# Patient Record
Sex: Female | Born: 1985 | Race: White | Hispanic: No | Marital: Single | Smoking: Never smoker
Health system: Southern US, Community
[De-identification: ages and names within clinical notes are randomized; demographics above are authoritative.]

## PROBLEM LIST (undated history)

## (undated) DIAGNOSIS — M1990 Unspecified osteoarthritis, unspecified site: Secondary | ICD-10-CM

## (undated) HISTORY — PX: ESOPHAGOGASTRODUODENOSCOPY: SHX1529

## (undated) HISTORY — DX: Unspecified osteoarthritis, unspecified site: M19.90

## (undated) HISTORY — PX: APPENDECTOMY: SHX54

## (undated) HISTORY — PX: KNEE SURGERY: SHX244

## (undated) HISTORY — PX: OTHER SURGICAL HISTORY: SHX169

---
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
## 2007-03-24 ENCOUNTER — Encounter (HOSPITAL_COMMUNITY): Admission: RE | Admit: 2007-03-24 | Discharge: 2007-04-23 | Payer: Self-pay | Admitting: Orthopaedic Surgery

## 2009-06-19 ENCOUNTER — Encounter: Payer: Self-pay | Admitting: Gastroenterology

## 2009-06-19 ENCOUNTER — Ambulatory Visit: Payer: Self-pay | Admitting: Gastroenterology

## 2009-06-19 DIAGNOSIS — K389 Disease of appendix, unspecified: Secondary | ICD-10-CM | POA: Insufficient documentation

## 2009-06-19 DIAGNOSIS — R1013 Epigastric pain: Secondary | ICD-10-CM | POA: Insufficient documentation

## 2009-06-19 DIAGNOSIS — K5909 Other constipation: Secondary | ICD-10-CM | POA: Insufficient documentation

## 2009-06-19 DIAGNOSIS — Z91040 Latex allergy status: Secondary | ICD-10-CM | POA: Insufficient documentation

## 2009-06-20 ENCOUNTER — Encounter: Payer: Self-pay | Admitting: Gastroenterology

## 2009-06-21 ENCOUNTER — Encounter: Payer: Self-pay | Admitting: Internal Medicine

## 2009-06-21 LAB — CONVERTED CEMR LAB
ALT: 13 units/L (ref 0–35)
AST: 16 units/L (ref 0–37)
Albumin: 4.7 g/dL (ref 3.5–5.2)
Alkaline Phosphatase: 44 units/L (ref 39–117)
BUN: 10 mg/dL (ref 6–23)
Basophils Absolute: 0 10*3/uL (ref 0.0–0.1)
Basophils Relative: 1 % (ref 0–1)
CO2: 22 meq/L (ref 19–32)
Calcium: 9.6 mg/dL (ref 8.4–10.5)
Chloride: 106 meq/L (ref 96–112)
Creatinine, Ser: 0.72 mg/dL (ref 0.40–1.20)
Eosinophils Absolute: 0.4 10*3/uL (ref 0.0–0.7)
Eosinophils Relative: 7 % — ABNORMAL HIGH (ref 0–5)
Glucose, Bld: 86 mg/dL (ref 70–99)
HCT: 39.3 % (ref 36.0–46.0)
Hemoglobin: 13.4 g/dL (ref 12.0–15.0)
Lipase: 15 units/L (ref 0–75)
Lymphocytes Relative: 34 % (ref 12–46)
Lymphs Abs: 2.1 10*3/uL (ref 0.7–4.0)
MCHC: 34.1 g/dL (ref 30.0–36.0)
MCV: 94.2 fL (ref 78.0–100.0)
Monocytes Absolute: 0.4 10*3/uL (ref 0.1–1.0)
Monocytes Relative: 7 % (ref 3–12)
Neutro Abs: 3.1 10*3/uL (ref 1.7–7.7)
Neutrophils Relative %: 51 % (ref 43–77)
Platelets: 323 10*3/uL (ref 150–400)
Potassium: 4.1 meq/L (ref 3.5–5.3)
RBC: 4.17 M/uL (ref 3.87–5.11)
RDW: 13 % (ref 11.5–15.5)
Sodium: 143 meq/L (ref 135–145)
Total Bilirubin: 0.5 mg/dL (ref 0.3–1.2)
Total Protein: 7.6 g/dL (ref 6.0–8.3)
WBC: 6.1 10*3/uL (ref 4.0–10.5)

## 2009-06-26 ENCOUNTER — Ambulatory Visit (HOSPITAL_COMMUNITY): Admission: RE | Admit: 2009-06-26 | Discharge: 2009-06-26 | Payer: Self-pay | Admitting: Internal Medicine

## 2009-06-26 ENCOUNTER — Ambulatory Visit: Payer: Self-pay | Admitting: Internal Medicine

## 2009-07-03 ENCOUNTER — Encounter: Payer: Self-pay | Admitting: Internal Medicine

## 2009-07-03 ENCOUNTER — Ambulatory Visit (HOSPITAL_COMMUNITY): Admission: RE | Admit: 2009-07-03 | Discharge: 2009-07-03 | Payer: Self-pay | Admitting: Internal Medicine

## 2009-07-04 ENCOUNTER — Encounter: Payer: Self-pay | Admitting: Internal Medicine

## 2009-07-26 ENCOUNTER — Encounter (INDEPENDENT_AMBULATORY_CARE_PROVIDER_SITE_OTHER): Payer: Self-pay

## 2009-11-18 ENCOUNTER — Ambulatory Visit (HOSPITAL_COMMUNITY): Admission: RE | Admit: 2009-11-18 | Discharge: 2009-11-18 | Payer: Self-pay | Admitting: Orthopaedic Surgery

## 2010-12-21 ENCOUNTER — Encounter: Payer: Self-pay | Admitting: Orthopaedic Surgery

## 2011-04-14 NOTE — Op Note (Signed)
Caroline Jackson, Caroline Jackson                ACCOUNT NO.:  [id number]   MEDICAL RECORD NO.:  [id number]          PATIENT TYPE:  AMB   LOCATION:  DAY                           FACILITY:  [facility]   PHYSICIAN:  R. Roetta Sessions, M.D. DATE OF BIRTH:  [birth year]   DATE OF PROCEDURE:  06/26/2009  DATE OF DISCHARGE:                               OPERATIVE REPORT   PROCEDURE:  Diagnostic esophagogastroduodenoscopy.   INDICATIONS FOR PROCEDURE:  The patient is a 25 year old lady with  intermittent epigastric pain, some background symptoms of constipation.  Symptoms do have both a postprandial component and a musculoskeletal  component.  An EGD is now being done to further evaluate her symptoms.  Risks, benefits and limitations reviewed previously and again at the  bedside.  Please see the documentation in the medical record.   PROCEDURE NOTE:  Oxygen saturation, blood pressure, pulse, respirations  were monitor throughout the entire procedure.  Conscious sedation:  Versed 5 mg IV, Demerol 100 mg IV in divided doses.  Instrument:  Pentax  video chip system.   FINDINGS:  Examination of tubular esophagus revealed normal-appearing  mucosa.  The EG junction was easily traversed.  The stomach:  Gastric  cavity was emptied, insufflated well with air.  Thorough examination of  the gastric mucosa, including retroflexion in the proximal stomach,  esophagogastric junction demonstrated apparently normal mucosa.  Pylorus  was patent, easily traversed.  Examination of the bulb and second  portion revealed no abnormalities.   Therapeutic/diagnostic maneuvers performed:  None.   The patient tolerated procedure well and was reacted in endoscopy.   IMPRESSION:  Normal esophagus, stomach, D1, D2.   No endoscopic explanation for the patient's symptoms.  She has lost a  significant amount of weight recently with [employer].  I feel we  ought to take a look at her gallbladder.   RECOMMENDATIONS:  1. Continue  MiraLax as needed, which has been very effective in      combating her constipation.  2. Gallbladder ultrasound.  3. Further recommendations to follow.      Jonathon Bellows, M.D.  Electronically Signed     RMR/MEDQ  D:  06/26/2009  T:  06/26/2009  Job:  [id number]   cc:   Doreen Beam, MD  Fax: [phone]

## 2012-05-20 ENCOUNTER — Emergency Department (HOSPITAL_COMMUNITY): Payer: 59

## 2012-05-20 ENCOUNTER — Encounter (HOSPITAL_COMMUNITY): Payer: Self-pay | Admitting: Anesthesiology

## 2012-05-20 ENCOUNTER — Encounter (HOSPITAL_COMMUNITY): Payer: Self-pay | Admitting: *Deleted

## 2012-05-20 ENCOUNTER — Encounter (HOSPITAL_COMMUNITY)
Admission: EM | Disposition: A | Discharge status: Home / Self Care | Payer: Self-pay | Source: Home / Self Care | Attending: Emergency Medicine

## 2012-05-20 ENCOUNTER — Emergency Department (HOSPITAL_COMMUNITY): Payer: 59 | Admitting: Anesthesiology

## 2012-05-20 ENCOUNTER — Ambulatory Visit (HOSPITAL_COMMUNITY)
Admission: EM | Admit: 2012-05-20 | Discharge: 2012-05-21 | Disposition: A | Discharge status: Home / Self Care | Payer: 59 | Attending: Surgery | Admitting: Surgery

## 2012-05-20 DIAGNOSIS — R1013 Epigastric pain: Secondary | ICD-10-CM

## 2012-05-20 DIAGNOSIS — K5909 Other constipation: Secondary | ICD-10-CM

## 2012-05-20 DIAGNOSIS — K3580 Unspecified acute appendicitis: Secondary | ICD-10-CM | POA: Insufficient documentation

## 2012-05-20 DIAGNOSIS — R1033 Periumbilical pain: Secondary | ICD-10-CM | POA: Insufficient documentation

## 2012-05-20 DIAGNOSIS — Z91040 Latex allergy status: Secondary | ICD-10-CM

## 2012-05-20 HISTORY — PX: LAPAROSCOPIC APPENDECTOMY: SHX408

## 2012-05-20 LAB — URINALYSIS, ROUTINE W REFLEX MICROSCOPIC
Bilirubin Urine: NEGATIVE
Glucose, UA: NEGATIVE mg/dL
Hgb urine dipstick: NEGATIVE
Ketones, ur: NEGATIVE mg/dL
Leukocytes, UA: NEGATIVE
Nitrite: NEGATIVE
Protein, ur: NEGATIVE mg/dL
Specific Gravity, Urine: 1.009 (ref 1.005–1.030)
Urobilinogen, UA: 0.2 mg/dL (ref 0.0–1.0)
pH: 6 (ref 5.0–8.0)

## 2012-05-20 LAB — CBC
HCT: 36.5 % (ref 36.0–46.0)
Hemoglobin: 12.7 g/dL (ref 12.0–15.0)
MCH: 32.4 pg (ref 26.0–34.0)
MCHC: 34.8 g/dL (ref 30.0–36.0)
MCV: 93.1 fL (ref 78.0–100.0)
Platelets: 261 10*3/uL (ref 150–400)
RBC: 3.92 MIL/uL (ref 3.87–5.11)
RDW: 13 % (ref 11.5–15.5)
WBC: 17.5 10*3/uL — ABNORMAL HIGH (ref 4.0–10.5)

## 2012-05-20 LAB — DIFFERENTIAL
Basophils Absolute: 0 10*3/uL (ref 0.0–0.1)
Basophils Relative: 0 % (ref 0–1)
Eosinophils Absolute: 0.1 10*3/uL (ref 0.0–0.7)
Eosinophils Relative: 1 % (ref 0–5)
Lymphocytes Relative: 9 % — ABNORMAL LOW (ref 12–46)
Lymphs Abs: 1.5 10*3/uL (ref 0.7–4.0)
Monocytes Absolute: 0.7 10*3/uL (ref 0.1–1.0)
Monocytes Relative: 4 % (ref 3–12)
Neutro Abs: 15.2 10*3/uL — ABNORMAL HIGH (ref 1.7–7.7)
Neutrophils Relative %: 87 % — ABNORMAL HIGH (ref 43–77)

## 2012-05-20 LAB — BASIC METABOLIC PANEL
BUN: 14 mg/dL (ref 6–23)
CO2: 23 mEq/L (ref 19–32)
Calcium: 9.1 mg/dL (ref 8.4–10.5)
Chloride: 105 mEq/L (ref 96–112)
Creatinine, Ser: 0.65 mg/dL (ref 0.50–1.10)
GFR calc Af Amer: >90 mL/min (ref >90)
GFR calc non Af Amer: >90 mL/min (ref >90)
Glucose, Bld: 89 mg/dL (ref 70–99)
Potassium: 3.4 mEq/L — ABNORMAL LOW (ref 3.5–5.1)
Sodium: 138 mEq/L (ref 135–145)

## 2012-05-20 LAB — WET PREP, GENITAL
Clue Cells Wet Prep HPF POC: NONE SEEN
Trich, Wet Prep: NONE SEEN
Yeast Wet Prep HPF POC: NONE SEEN

## 2012-05-20 LAB — POCT PREGNANCY, URINE: Preg Test, Ur: NEGATIVE

## 2012-05-20 SURGERY — APPENDECTOMY, LAPAROSCOPIC
Anesthesia: General | Site: Abdomen | Wound class: Contaminated

## 2012-05-20 MED ORDER — PANTOPRAZOLE SODIUM 40 MG IV SOLR
40.0000 mg | INTRAVENOUS | Status: DC
  Filled 2012-05-20: qty 40

## 2012-05-20 MED ORDER — SUCCINYLCHOLINE CHLORIDE 20 MG/ML IJ SOLN
INTRAMUSCULAR | Status: DC | PRN
  Administered 2012-05-20: 100 mg via INTRAVENOUS

## 2012-05-20 MED ORDER — GLYCOPYRROLATE 0.2 MG/ML IJ SOLN
INTRAMUSCULAR | Status: DC | PRN
  Administered 2012-05-20: .8 mg via INTRAVENOUS

## 2012-05-20 MED ORDER — OXYCODONE-ACETAMINOPHEN 5-325 MG PO TABS
1.0000 | ORAL_TABLET | ORAL | Status: DC | PRN
  Administered 2012-05-21: 2 via ORAL
  Filled 2012-05-20: qty 2

## 2012-05-20 MED ORDER — ONDANSETRON HCL 4 MG/2ML IJ SOLN
INTRAMUSCULAR | Status: DC | PRN
  Administered 2012-05-20: 4 mg via INTRAVENOUS

## 2012-05-20 MED ORDER — MORPHINE SULFATE 4 MG/ML IJ SOLN
4.0000 mg | Freq: Once | INTRAMUSCULAR | Status: AC
  Administered 2012-05-20: 4 mg via INTRAVENOUS
  Filled 2012-05-20: qty 1

## 2012-05-20 MED ORDER — ONDANSETRON HCL 4 MG/2ML IJ SOLN
4.0000 mg | Freq: Once | INTRAMUSCULAR | Status: AC
  Administered 2012-05-20: 4 mg via INTRAVENOUS
  Filled 2012-05-20: qty 2

## 2012-05-20 MED ORDER — PROMETHAZINE HCL 25 MG/ML IJ SOLN
6.2500 mg | INTRAMUSCULAR | Status: DC | PRN
  Administered 2012-05-20: 6.25 mg via INTRAVENOUS

## 2012-05-20 MED ORDER — MORPHINE SULFATE 2 MG/ML IJ SOLN: 1.0000 mg | INTRAMUSCULAR | Status: DC | PRN

## 2012-05-20 MED ORDER — NEOSTIGMINE METHYLSULFATE 1 MG/ML IJ SOLN
INTRAMUSCULAR | Status: DC | PRN
  Administered 2012-05-20: 5 mg via INTRAVENOUS

## 2012-05-20 MED ORDER — SODIUM CHLORIDE 0.9 % IR SOLN
Status: DC | PRN
  Administered 2012-05-20: 1000 mL

## 2012-05-20 MED ORDER — KCL IN DEXTROSE-NACL 20-5-0.45 MEQ/L-%-% IV SOLN
INTRAVENOUS | Status: DC
  Administered 2012-05-20 – 2012-05-21 (×2): via INTRAVENOUS
  Filled 2012-05-20 (×5): qty 1000

## 2012-05-20 MED ORDER — LACTATED RINGERS IV SOLN
INTRAVENOUS | Status: DC | PRN
  Administered 2012-05-20: 20:00:00 via INTRAVENOUS

## 2012-05-20 MED ORDER — ONDANSETRON HCL 4 MG PO TABS: 4.0000 mg | ORAL_TABLET | Freq: Four times a day (QID) | ORAL | Status: DC | PRN

## 2012-05-20 MED ORDER — KETOROLAC TROMETHAMINE 30 MG/ML IJ SOLN
15.0000 mg | Freq: Once | INTRAMUSCULAR | Status: AC | PRN
  Administered 2012-05-20: 30 mg via INTRAVENOUS

## 2012-05-20 MED ORDER — FENTANYL CITRATE 0.05 MG/ML IJ SOLN
INTRAMUSCULAR | Status: DC | PRN
  Administered 2012-05-20: 50 ug via INTRAVENOUS
  Administered 2012-05-20: 100 ug via INTRAVENOUS

## 2012-05-20 MED ORDER — IOHEXOL 300 MG/ML  SOLN
20.0000 mL | INTRAMUSCULAR | Status: AC
  Administered 2012-05-20 (×2): 20 mL via ORAL

## 2012-05-20 MED ORDER — SODIUM CHLORIDE 0.9 % IV SOLN
3.0000 g | INTRAVENOUS | Status: DC
  Filled 2012-05-20: qty 3

## 2012-05-20 MED ORDER — HYDROMORPHONE HCL PF 1 MG/ML IJ SOLN: 0.2500 mg | INTRAMUSCULAR | Status: DC | PRN

## 2012-05-20 MED ORDER — MIDAZOLAM HCL 5 MG/5ML IJ SOLN
INTRAMUSCULAR | Status: DC | PRN
  Administered 2012-05-20: 2 mg via INTRAVENOUS

## 2012-05-20 MED ORDER — NORETHINDRONE 0.35 MG PO TABS: 1.0000 | ORAL_TABLET | Freq: Every evening | ORAL | Status: DC

## 2012-05-20 MED ORDER — ENOXAPARIN SODIUM 40 MG/0.4ML ~~LOC~~ SOLN
40.0000 mg | SUBCUTANEOUS | Status: DC
  Filled 2012-05-20 (×2): qty 0.4

## 2012-05-20 MED ORDER — ROCURONIUM BROMIDE 100 MG/10ML IV SOLN
INTRAVENOUS | Status: DC | PRN
  Administered 2012-05-20: 40 mg via INTRAVENOUS

## 2012-05-20 MED ORDER — MEPERIDINE HCL 25 MG/ML IJ SOLN: 6.2500 mg | INTRAMUSCULAR | Status: DC | PRN

## 2012-05-20 MED ORDER — ONDANSETRON HCL 4 MG/2ML IJ SOLN: 4.0000 mg | Freq: Four times a day (QID) | INTRAMUSCULAR | Status: DC | PRN

## 2012-05-20 MED ORDER — AMPICILLIN-SULBACTAM SODIUM 3 (2-1) G IJ SOLR
3.0000 g | INTRAMUSCULAR | Status: DC | PRN
  Administered 2012-05-20: 3 g via INTRAVENOUS

## 2012-05-20 MED ORDER — PROPOFOL 10 MG/ML IV BOLUS
INTRAVENOUS | Status: DC | PRN
  Administered 2012-05-20: 200 mg via INTRAVENOUS

## 2012-05-20 MED ORDER — IOHEXOL 300 MG/ML  SOLN
100.0000 mL | Freq: Once | INTRAMUSCULAR | Status: AC | PRN
  Administered 2012-05-20: 100 mL via INTRAVENOUS

## 2012-05-20 MED ORDER — SODIUM CHLORIDE 0.9 % IV SOLN
INTRAVENOUS | Status: DC | PRN
  Administered 2012-05-20: 19:00:00 via INTRAVENOUS

## 2012-05-20 MED ORDER — LIDOCAINE HCL (CARDIAC) 20 MG/ML IV SOLN
INTRAVENOUS | Status: DC | PRN
  Administered 2012-05-20: 60 mg via INTRAVENOUS

## 2012-05-20 SURGICAL SUPPLY — 43 items
APPLIER CLIP ROT 10 11.4 M/L (STAPLE)
BENZOIN TINCTURE PRP APPL 2/3 (GAUZE/BANDAGES/DRESSINGS) ×2 IMPLANT
BLADE SURG ROTATE 9660 (MISCELLANEOUS) IMPLANT
CANISTER SUCTION 2500CC (MISCELLANEOUS) ×2 IMPLANT
CHLORAPREP W/TINT 26ML (MISCELLANEOUS) ×2 IMPLANT
CLIP APPLIE ROT 10 11.4 M/L (STAPLE) IMPLANT
CLOTH BEACON ORANGE TIMEOUT ST (SAFETY) ×2 IMPLANT
COVER SURGICAL LIGHT HANDLE (MISCELLANEOUS) ×2 IMPLANT
CUTTER FLEX LINEAR 45M (STAPLE) ×2 IMPLANT
DECANTER SPIKE VIAL GLASS SM (MISCELLANEOUS) ×2 IMPLANT
DERMABOND ADHESIVE PROPEN (GAUZE/BANDAGES/DRESSINGS) ×2
DERMABOND ADVANCED .7 DNX6 (GAUZE/BANDAGES/DRESSINGS) ×2 IMPLANT
DRAPE UTILITY 15X26 W/TAPE STR (DRAPE) ×4 IMPLANT
ELECT REM PT RETURN 9FT ADLT (ELECTROSURGICAL) ×2
ELECTRODE REM PT RTRN 9FT ADLT (ELECTROSURGICAL) ×1 IMPLANT
ENDOLOOP SUT PDS II  0 18 (SUTURE)
ENDOLOOP SUT PDS II 0 18 (SUTURE) IMPLANT
GLOVE BIOGEL M STRL SZ7.5 (GLOVE) IMPLANT
GLOVE BIOGEL PI IND STRL 8 (GLOVE) ×1 IMPLANT
GLOVE BIOGEL PI INDICATOR 8 (GLOVE) ×1
GLOVE SS N UNI LF 7.0 STRL (GLOVE) ×6 IMPLANT
GLOVE SS N UNI LF 7.5 STRL (GLOVE) ×2 IMPLANT
GOWN STRL NON-REIN LRG LVL3 (GOWN DISPOSABLE) ×4 IMPLANT
GOWN STRL REIN XL XLG (GOWN DISPOSABLE) ×2 IMPLANT
KIT BASIN OR (CUSTOM PROCEDURE TRAY) ×2 IMPLANT
KIT ROOM TURNOVER OR (KITS) ×2 IMPLANT
NS IRRIG 1000ML POUR BTL (IV SOLUTION) ×2 IMPLANT
PAD ARMBOARD 7.5X6 YLW CONV (MISCELLANEOUS) ×4 IMPLANT
POUCH SPECIMEN RETRIEVAL 10MM (ENDOMECHANICALS) ×2 IMPLANT
RELOAD 45 VASCULAR/THIN (ENDOMECHANICALS) ×2 IMPLANT
RELOAD STAPLE TA45 3.5 REG BLU (ENDOMECHANICALS) IMPLANT
SCALPEL HARMONIC ACE (MISCELLANEOUS) ×2 IMPLANT
SCISSORS LAP 5X35 DISP (ENDOMECHANICALS) IMPLANT
SET IRRIG TUBING LAPAROSCOPIC (IRRIGATION / IRRIGATOR) ×2 IMPLANT
SPECIMEN JAR SMALL (MISCELLANEOUS) ×2 IMPLANT
SUT MNCRL AB 4-0 PS2 18 (SUTURE) ×2 IMPLANT
SUT VICRYL 0 UR6 27IN ABS (SUTURE) IMPLANT
TOWEL OR 17X24 6PK STRL BLUE (TOWEL DISPOSABLE) ×2 IMPLANT
TOWEL OR 17X26 10 PK STRL BLUE (TOWEL DISPOSABLE) ×2 IMPLANT
TRAY FOLEY CATH 14FR (SET/KITS/TRAYS/PACK) ×2 IMPLANT
TRAY LAPAROSCOPIC (CUSTOM PROCEDURE TRAY) ×2 IMPLANT
TROCAR XCEL BLADELESS 5X75MML (TROCAR) ×4 IMPLANT
TROCAR XCEL BLUNT TIP 100MML (ENDOMECHANICALS) ×2 IMPLANT

## 2012-05-20 NOTE — ED Notes (Signed)
Lower rt. Abd. Pain since 11 am. Worse pain ever felt. Mild n/v.

## 2012-05-20 NOTE — Anesthesia Postprocedure Evaluation (Signed)
  Anesthesia Post-op Note  Patient: Caroline Jackson  Procedure(s) Performed: Procedure(s) (LRB): APPENDECTOMY LAPAROSCOPIC (N/A)  Patient Location: PACU  Anesthesia Type: General  Level of Consciousness: sedated  Airway and Oxygen Therapy: Patient Spontanous Breathing  Post-op Pain: mild  Post-op Assessment: Post-op Vital signs reviewed  Post-op Vital Signs: stable  Complications: No apparent anesthesia complications

## 2012-05-20 NOTE — ED Notes (Signed)
Patient is resting comfortably. 

## 2012-05-20 NOTE — Anesthesia Preprocedure Evaluation (Signed)
Anesthesia Evaluation  Patient identified by MRN, date of birth, ID band Patient awake    Reviewed: Allergy & Precautions, H&P , NPO status , Patient's Chart, lab work & pertinent test results  History of Anesthesia Complications Negative for: history of anesthetic complications  Airway Mallampati: I  Neck ROM: Full    Dental  (+) Teeth Intact and Dental Advisory Given   Pulmonary neg pulmonary ROS,  breath sounds clear to auscultation        Cardiovascular negative cardio ROS  Rhythm:Regular Rate:Normal     Neuro/Psych    GI/Hepatic negative GI ROS, Neg liver ROS,   Endo/Other  negative endocrine ROS  Renal/GU negative Renal ROS     Musculoskeletal   Abdominal   Peds  Hematology   Anesthesia Other Findings   Reproductive/Obstetrics                           Anesthesia Physical Anesthesia Plan  ASA: I and Emergent  Anesthesia Plan: General   Post-op Pain Management:    Induction: Intravenous  Airway Management Planned: Oral ETT  Additional Equipment:   Intra-op Plan:   Post-operative Plan: Extubation in OR  Informed Consent: I have reviewed the patients History and Physical, chart, labs and discussed the procedure including the risks, benefits and alternatives for the proposed anesthesia with the patient or authorized representative who has indicated his/her understanding and acceptance.   Dental advisory given  Plan Discussed with: CRNA and Surgeon  Anesthesia Plan Comments:         Anesthesia Quick Evaluation

## 2012-05-20 NOTE — Anesthesia Procedure Notes (Signed)
Procedure Name: Intubation Date/Time: 05/20/2012 7:41 PM Performed by: Molli Hazard Pre-anesthesia Checklist: Patient identified, Emergency Drugs available, Suction available and Patient being monitored Patient Re-evaluated:Patient Re-evaluated prior to inductionOxygen Delivery Method: Circle system utilized Preoxygenation: Pre-oxygenation with 100% oxygen Intubation Type: IV induction and Rapid sequence Laryngoscope Size: Miller and 2 Grade View: Grade I Tube type: Oral Tube size: 7.5 mm Number of attempts: 1 Airway Equipment and Method: Stylet Placement Confirmation: ETT inserted through vocal cords under direct vision,  positive ETCO2 and breath sounds checked- equal and bilateral Secured at: 23 cm Tube secured with: Tape Dental Injury: Teeth and Oropharynx as per pre-operative assessment

## 2012-05-20 NOTE — ED Provider Notes (Signed)
Patient alert, relatively comfortable now. She's able to ambuulate to the bathroom. CT scan has returned and is consistent with early appendicitis. She has required additional antiemetics, and pain control, with morphine. I contacted the general surgeon to see the patient for an operative procedure.   Medical screening examination/treatment/procedure(s) were conducted as a shared visit with non-physician practitioner(s) and myself.  I personally evaluated the patient during the encounter    Flint Melter, MD 05/21/12 0002

## 2012-05-20 NOTE — ED Provider Notes (Signed)
History     CSN: [id number]  Arrival date & time 05/20/12  1315   First MD Initiated Contact with Patient 05/20/12 1345      Chief Complaint  Patient presents with  . Abdominal Pain    (Consider location/radiation/quality/duration/timing/severity/associated sxs/prior treatment) Patient is a 26 y.o. female presenting with abdominal pain. The history is provided by the patient.  Abdominal Pain The primary symptoms of the illness include abdominal pain and nausea. The primary symptoms of the illness do not include fever, vomiting, diarrhea, dysuria, vaginal discharge or vaginal bleeding. The current episode started 3 to 5 hours ago. The onset of the illness was sudden. The problem has been gradually worsening.  The patient states that she believes she is currently not pregnant. The patient has not had a change in bowel habit. Additional symptoms associated with the illness include anorexia. Symptoms associated with the illness do not include chills, constipation, urgency, hematuria, frequency or back pain.  Pt states pain to the right lower abdomen. Worse with palpation and movement. Denies fever, denies urinary symptoms, denies vaginal discharge or bleeding. Did not take any medications prior to the arrival. No hx of the same  History reviewed. No pertinent past medical history.  Past Surgical History  Procedure Date  . Orthoscopic left knee    No family history on file.  History  Substance Use Topics  . Smoking status: Not on file  . Smokeless tobacco: Not on file  . Alcohol Use:     OB History    Grav Para Term Preterm Abortions TAB SAB Ect Mult Living                  Review of Systems  Constitutional: Negative for fever and chills.  Respiratory: Negative.   Cardiovascular: Negative.   Gastrointestinal: Positive for nausea, abdominal pain and anorexia. Negative for vomiting, diarrhea and constipation.  Genitourinary: Negative for dysuria, urgency, frequency,  hematuria, vaginal bleeding and vaginal discharge.  Musculoskeletal: Negative for back pain.  Skin: Negative.     Allergies  Latex  Home Medications   Current Outpatient Rx  Name Route Sig Dispense Refill  . NAPROXEN SODIUM 220 MG PO TABS Oral Take 440 mg by mouth daily as needed. As needed for pain.    . NORETHINDRONE 0.35 MG PO TABS Oral Take 1 tablet by mouth every evening.      BP 124/72  Pulse 99  Temp 97.8 F (36.6 C) (Oral)  Resp 16  SpO2 100%  LMP 04/29/2012  Physical Exam  Nursing note and vitals reviewed. Constitutional: She appears well-developed and well-nourished. No distress.  HENT:  Head: Normocephalic.  Eyes: Conjunctivae are normal.  Neck: Neck supple.  Cardiovascular: Normal rate, regular rhythm and normal heart sounds.   Pulmonary/Chest: Effort normal and breath sounds normal. No respiratory distress. She has no wheezes. She has no rales.  Abdominal: Soft. Bowel sounds are normal. There is no rebound.       RLQ tenderness, pain in RLQ with palpation over RUQ and LLQ. Guarding. No CVA tenderness  Genitourinary:       Normal external genetalia, small white discharge, no CMT, no adnexal tenderness  Neurological: She is alert.  Skin: Skin is warm and dry.    ED Course  Procedures (including critical care time)  Pt with RLQ, will get blood, pelvic exam, pain medications ordered.   Results for orders placed during the hospital encounter of 05/20/12  CBC      Component Value Range  WBC 17.5 (*) 4.0 - 10.5 K/uL   RBC 3.92  3.87 - 5.11 MIL/uL   Hemoglobin 12.7  12.0 - 15.0 g/dL   HCT 78.2  95.6 - 21.3 %   MCV 93.1  78.0 - 100.0 fL   MCH 32.4  26.0 - 34.0 pg   MCHC 34.8  30.0 - 36.0 g/dL   RDW 08.6  57.8 - 46.9 %   Platelets 261  150 - 400 K/uL  DIFFERENTIAL      Component Value Range   Neutrophils Relative 87 (*) 43 - 77 %   Neutro Abs 15.2 (*) 1.7 - 7.7 K/uL   Lymphocytes Relative 9 (*) 12 - 46 %   Lymphs Abs 1.5  0.7 - 4.0 K/uL   Monocytes  Relative 4  3 - 12 %   Monocytes Absolute 0.7  0.1 - 1.0 K/uL   Eosinophils Relative 1  0 - 5 %   Eosinophils Absolute 0.1  0.0 - 0.7 K/uL   Basophils Relative 0  0 - 1 %   Basophils Absolute 0.0  0.0 - 0.1 K/uL  BASIC METABOLIC PANEL      Component Value Range   Sodium 138  135 - 145 mEq/L   Potassium 3.4 (*) 3.5 - 5.1 mEq/L   Chloride 105  96 - 112 mEq/L   CO2 23  19 - 32 mEq/L   Glucose, Bld 89  70 - 99 mg/dL   BUN 14  6 - 23 mg/dL   Creatinine, Ser 6.29  0.50 - 1.10 mg/dL   Calcium 9.1  8.4 - 52.8 mg/dL   GFR calc non Af Amer >90  >90 mL/min   GFR calc Af Amer >90  >90 mL/min  URINALYSIS, ROUTINE W REFLEX MICROSCOPIC      Component Value Range   Color, Urine YELLOW  YELLOW   APPearance CLEAR  CLEAR   Specific Gravity, Urine 1.009  1.005 - 1.030   pH 6.0  5.0 - 8.0   Glucose, UA NEGATIVE  NEGATIVE mg/dL   Hgb urine dipstick NEGATIVE  NEGATIVE   Bilirubin Urine NEGATIVE  NEGATIVE   Ketones, ur NEGATIVE  NEGATIVE mg/dL   Protein, ur NEGATIVE  NEGATIVE mg/dL   Urobilinogen, UA 0.2  0.0 - 1.0 mg/dL   Nitrite NEGATIVE  NEGATIVE   Leukocytes, UA NEGATIVE  NEGATIVE  WET PREP, GENITAL      Component Value Range   Yeast Wet Prep HPF POC NONE SEEN  NONE SEEN   Trich, Wet Prep NONE SEEN  NONE SEEN   Clue Cells Wet Prep HPF POC NONE SEEN  NONE SEEN   WBC, Wet Prep HPF POC MODERATE (*) NONE SEEN  POCT PREGNANCY, URINE      Component Value Range   Preg Test, Ur NEGATIVE  NEGATIVE   No results found.  Elevated WBC, no adnexal tenderness. Will get CT to r/o appendicitis.    No diagnosis found.    MDM          Lottie Mussel, PA 05/23/12 1626

## 2012-05-20 NOTE — Preoperative (Signed)
Beta Blockers   Reason not to administer Beta Blockers:Not Applicable 

## 2012-05-20 NOTE — Op Note (Signed)
Appendectomy, Lap, Procedure Note  Indications: The patient presented with a history of right-sided abdominal pain. A CT revealed findings consistent with acute appendicitis.  Pre-operative Diagnosis: Acute appendicitis without mention of peritonitis  Post-operative Diagnosis: Same  Surgeon: [name]   Assistants: none  Anesthesia: General endotracheal anesthesia and Local anesthesia 0.25.% bupivacaine, with epinephrine  ASA Class: 1, E  Procedure Details  The patient was seen again in the Holding Room. The risks, benefits, complications, treatment options, and expected outcomes were discussed with the patient and/or family. The possibilities of perforation of viscus, bleeding, recurrent infection, the need for additional procedures, failure to diagnose a condition, and creating a complication requiring transfusion or operation were discussed. There was concurrence with the proposed plan and informed consent was obtained. The site of surgery was properly noted. The patient was taken to Operating Room, identified as Caroline Jackson and the procedure verified as Appendectomy. A Time Out was held and the above information confirmed.  The patient was placed in the supine position and general anesthesia was induced, along with placement of orogastric tube, SCDs, and a Foley catheter. The abdomen was prepped and draped in a sterile fashion. A 1.5 centimeter infraumbilical incision was made.  The umbilical stalk was elevated, and the midline fascia was incised with a #11 blade.  A Kelly clamp was used to confirm entrance into the peritoneal cavity.  A pursestring suture was passed around the incision with a 0 Vicryl.  A 12mm Hasson was introduced into the abdomen and the tails of the suture were used to hold the Hasson in place.   The pneumoperitoneum was then established to steady pressure of 15 mmHg.  Additional 5 mm cannulas then placed in the left lower quadrant of the abdomen and the suprapubic  region under direct visualization. A careful evaluation of the entire abdomen was carried out. The patient was placed in Trendelenburg and left lateral decubitus position. The small intestines were retracted in the cephalad and left lateral direction away from the pelvis and right lower quadrant. The patient was found to have an inflammed appendix that was extending into the pelvis. There was no evidence of perforation.  The appendix was carefully dissected. The appendix was was skeletonized with the harmonic scalpel.   The appendix was divided at its base using an endo-GIA stapler with a white load. No appendiceal stump was left in place. The appendix was removed from the abdomen with an Endocatch bag through the umbilical port.  There was no evidence of bleeding, leakage, or complication after division of the appendix. Irrigation was also performed and irrigate suctioned from the abdomen as well.  The umbilical port site was closed with the purse string suture. There was no residual palpable fascial defect.  The trocar site skin wounds were closed with 4-0 Monocryl.  Instrument, sponge, and needle counts were correct at the conclusion of the case.   Findings: The appendix was found to be inflamed. There were signs of necrosis.  There was not perforation. There was not abscess formation.  Estimated Blood Loss:  Minimal         Drains: none               Specimens: appendix         Complications:  None; patient tolerated the procedure well.         Disposition: PACU - hemodynamically stable.         Condition: stable  Mary Sella. Andrey Campanile, MD, FACS General,  Bariatric, & Minimally Invasive Surgery [facility]

## 2012-05-20 NOTE — ED Notes (Signed)
Family at bedside. 

## 2012-05-20 NOTE — Transfer of Care (Signed)
Immediate Anesthesia Transfer of Care Note  Patient: Caroline Jackson  Procedure(s) Performed: Procedure(s) (LRB): APPENDECTOMY LAPAROSCOPIC (N/A)  Patient Location: PACU  Anesthesia Type: General  Level of Consciousness: awake, alert  and oriented  Airway & Oxygen Therapy: Patient connected to nasal cannula oxygen  Post-op Assessment: Report given to PACU RN, Post -op Vital signs reviewed and stable and Patient moving all extremities X 4  Post vital signs: Reviewed and stable  Complications: No apparent anesthesia complications

## 2012-05-20 NOTE — H&P (Signed)
Caroline Jackson is an 26 y.o. female.   Chief Complaint: belly pain  HPI: 26 yo WF who developed periumbilical pain around 11:30 am. Pain was sharp and stabbing and shifted later to RLQ. Pt had to leave work and came to ED. No prior symptoms. Pain constant but would ease up at times. +chills. Denies fever/nausea/vomiting/diarrhea/constipation/melena/hematochezia/vag discharge. LMP last month. Last BM this am. +anorexia.    Denies meds & allergies other than daily OCP.  History reviewed. No pertinent past medical history.  Past Surgical History  Procedure Date  . Orthoscopic left knee  . Esophagogastroduodenoscopy     No family history on file. Social History:  reports that she has never smoked. She does not have any smokeless tobacco history on file. She reports that she drinks alcohol. She reports that she does not use illicit drugs.  Allergies:  Allergies  Allergen Reactions  . Latex Rash     (Not in a hospital admission)  Results for orders placed during the hospital encounter of 05/20/12 (from the past 48 hour(s))  CBC     Status: Abnormal   Collection Time   05/20/12  2:13 PM      Component Value Range Comment   WBC 17.5 (*) 4.0 - 10.5 K/uL    RBC 3.92  3.87 - 5.11 MIL/uL    Hemoglobin 12.7  12.0 - 15.0 g/dL    HCT 40.9  81.1 - 91.4 %    MCV 93.1  78.0 - 100.0 fL    MCH 32.4  26.0 - 34.0 pg    MCHC 34.8  30.0 - 36.0 g/dL    RDW 78.2  95.6 - 21.3 %    Platelets 261  150 - 400 K/uL   DIFFERENTIAL     Status: Abnormal   Collection Time   05/20/12  2:13 PM      Component Value Range Comment   Neutrophils Relative 87 (*) 43 - 77 %    Neutro Abs 15.2 (*) 1.7 - 7.7 K/uL    Lymphocytes Relative 9 (*) 12 - 46 %    Lymphs Abs 1.5  0.7 - 4.0 K/uL    Monocytes Relative 4  3 - 12 %    Monocytes Absolute 0.7  0.1 - 1.0 K/uL    Eosinophils Relative 1  0 - 5 %    Eosinophils Absolute 0.1  0.0 - 0.7 K/uL    Basophils Relative 0  0 - 1 %    Basophils Absolute 0.0  0.0 - 0.1 K/uL    BASIC METABOLIC PANEL     Status: Abnormal   Collection Time   05/20/12  2:13 PM      Component Value Range Comment   Sodium 138  135 - 145 mEq/L    Potassium 3.4 (*) 3.5 - 5.1 mEq/L    Chloride 105  96 - 112 mEq/L    CO2 23  19 - 32 mEq/L    Glucose, Bld 89  70 - 99 mg/dL    BUN 14  6 - 23 mg/dL    Creatinine, Ser 0.86  0.50 - 1.10 mg/dL    Calcium 9.1  8.4 - 57.8 mg/dL    GFR calc non Af Amer >90  >90 mL/min    GFR calc Af Amer >90  >90 mL/min   URINALYSIS, ROUTINE W REFLEX MICROSCOPIC     Status: Normal   Collection Time   05/20/12  3:02 PM      Component Value Range  Comment   Color, Urine YELLOW  YELLOW    APPearance CLEAR  CLEAR    Specific Gravity, Urine 1.009  1.005 - 1.030    pH 6.0  5.0 - 8.0    Glucose, UA NEGATIVE  NEGATIVE mg/dL    Hgb urine dipstick NEGATIVE  NEGATIVE    Bilirubin Urine NEGATIVE  NEGATIVE    Ketones, ur NEGATIVE  NEGATIVE mg/dL    Protein, ur NEGATIVE  NEGATIVE mg/dL    Urobilinogen, UA 0.2  0.0 - 1.0 mg/dL    Nitrite NEGATIVE  NEGATIVE    Leukocytes, UA NEGATIVE  NEGATIVE MICROSCOPIC NOT DONE ON URINES WITH NEGATIVE PROTEIN, BLOOD, LEUKOCYTES, NITRITE, OR GLUCOSE <1000 mg/dL.  POCT PREGNANCY, URINE     Status: Normal   Collection Time   05/20/12  3:10 PM      Component Value Range Comment   Preg Test, Ur NEGATIVE  NEGATIVE   WET PREP, GENITAL     Status: Abnormal   Collection Time   05/20/12  3:31 PM      Component Value Range Comment   Yeast Wet Prep HPF POC NONE SEEN  NONE SEEN OVERDILUTED PRIOR TO ARRIVAL IN LABORATORY   Trich, Wet Prep NONE SEEN  NONE SEEN    Clue Cells Wet Prep HPF POC NONE SEEN  NONE SEEN    WBC, Wet Prep HPF POC MODERATE (*) NONE SEEN MANY BACTERIA PRESENT    RADIOLOGICAL STUDIES: I have personally reviewed the radiological exams myself  Ct Abdomen Pelvis W Contrast  05/20/2012  *RADIOLOGY REPORT*  Clinical Data: Right lower quadrant pain.  CT ABDOMEN AND PELVIS WITH CONTRAST  Technique:  Multidetector CT imaging  of the abdomen and pelvis was performed following the standard protocol during bolus administration of intravenous contrast.  Contrast:  100 ml Omnipaque 300 IV.  Comparison: Ultrasound 07/03/2009  Findings: The appendix is mildly dilated with surrounding inflammatory change.  Appendicolith within the midportion of the appendix.  Findings compatible with early appendicitis.  Lung bases are clear.  No effusions.  Heart is normal size.  Liver, gallbladder, spleen, pancreas, adrenals and kidneys are unremarkable.  Elongated cystic tubular structure in the left adnexa, question ovarian cyst versus hydrosalpinx.  Uterus and right adnexa unremarkable.  Moderate stool throughout the colon.  Small bowel is decompressed. No free fluid, free air or adenopathy.  No acute bony abnormality.  IMPRESSION: Mildly dilated appendix with appendicolith in the midportion and early surrounding stranding.  Findings compatible with early acute appendicitis.  Original Report Authenticated By: Cyndie Chime, M.D.    Review of Systems  Constitutional: Positive for chills. Negative for fever, weight loss and diaphoresis.  HENT: Negative for hearing loss.   Eyes: Negative for discharge and redness.  Respiratory: Negative for shortness of breath.   Cardiovascular: Negative for chest pain, palpitations, leg swelling and PND.  Gastrointestinal: Positive for abdominal pain. Negative for nausea, vomiting, diarrhea, blood in stool and melena.  Genitourinary: Negative for dysuria, urgency and hematuria.  Musculoskeletal:       Has had several L knee scopes  Skin: Negative for rash.  Neurological: Negative for dizziness, tingling, focal weakness, seizures, weakness and headaches.  Psychiatric/Behavioral: Negative for memory loss and substance abuse.    Blood pressure 99/57, pulse 95, temperature 98.7 F (37.1 C), temperature source Oral, resp. rate 18, last menstrual period 04/29/2012, SpO2 98.00%. Physical Exam  Vitals  reviewed. Constitutional: She is oriented to person, place, and time. She appears well-developed and well-nourished. No  distress.  HENT:  Head: Normocephalic and atraumatic.  Right Ear: External ear normal.  Left Ear: External ear normal.  Eyes: Conjunctivae are normal. No scleral icterus.  Neck: Normal range of motion. Neck supple. No tracheal deviation present. No thyromegaly present.  Cardiovascular: Normal rate, regular rhythm and normal heart sounds.   Respiratory: Effort normal and breath sounds normal. No respiratory distress. She has no wheezes.  GI: Soft. She exhibits no distension. There is tenderness in the right lower quadrant and suprapubic area. There is guarding and tenderness at McBurney's point. There is no rigidity and no rebound.  Musculoskeletal: Normal range of motion. She exhibits no edema and no tenderness.  Neurological: She is alert and oriented to person, place, and time. She exhibits normal muscle tone.  Skin: Skin is warm and dry. No rash noted. She is not diaphoretic. No erythema.  Psychiatric: She has a normal mood and affect. Her behavior is normal. Judgment and thought content normal.     Assessment/Plan We discussed the etiology and management of acute appendicitis. We discussed operative and nonoperative management.  I recommended operative management along with IV antibiotics.  We discussed laparoscopic appendectomy. We discussed the risk and benefits of surgery including but not limited to bleeding, infection, injury to surrounding structures, need to convert to an open procedure, blood clot formation, post operative abscess or wound infection, staple line complications such as leak or bleeding, hernia formation, post operative ileus, need for additional procedures, anesthesia complications, and the typical postoperative course. I explained that the patient should expect a good improvement in their symptoms.  Mary Sella. Andrey Campanile, MD, FACS General, Bariatric, &  Minimally Invasive Surgery [facility] [place]   [facility] M 05/20/2012, 6:40 PM

## 2012-05-21 ENCOUNTER — Encounter (HOSPITAL_COMMUNITY): Payer: Self-pay | Admitting: *Deleted

## 2012-05-21 LAB — CBC
HCT: 33.9 % — ABNORMAL LOW (ref 36.0–46.0)
Hemoglobin: 11.5 g/dL — ABNORMAL LOW (ref 12.0–15.0)
MCH: 32.6 pg (ref 26.0–34.0)
MCHC: 33.9 g/dL (ref 30.0–36.0)
MCV: 96 fL (ref 78.0–100.0)
Platelets: 228 10*3/uL (ref 150–400)
RBC: 3.53 MIL/uL — ABNORMAL LOW (ref 3.87–5.11)
RDW: 13.4 % (ref 11.5–15.5)
WBC: 10.1 10*3/uL (ref 4.0–10.5)

## 2012-05-21 LAB — GC/CHLAMYDIA PROBE AMP, GENITAL
Chlamydia, DNA Probe: NEGATIVE
GC Probe Amp, Genital: NEGATIVE

## 2012-05-21 MED ORDER — PANTOPRAZOLE SODIUM 40 MG PO TBEC: 40.0000 mg | DELAYED_RELEASE_TABLET | Freq: Every day | ORAL | Status: DC

## 2012-05-21 MED ORDER — OXYCODONE-ACETAMINOPHEN 5-325 MG PO TABS: 1.0000 | ORAL_TABLET | ORAL | Status: AC | PRN

## 2012-05-21 NOTE — Discharge Summary (Signed)
Physician Discharge Summary  Patient ID: Caroline Jackson MRN: [id number] DOB/AGE: 28/12/1985 28 y.o.  Admit date: 05/20/2012 Discharge date: 05/21/2012  Admission Diagnoses:acute appendicitis  Discharge Diagnoses: same Principal Problem:  *Acute appendicitis   Discharged Condition: good  Hospital Course: unremarkable.  Consults: None  Significant Diagnostic Studies: CT abdomen pelvis  Treatments: surgery: lap appendectomy  Discharge Exam: Blood pressure 80/51, pulse 65, temperature 98 F (36.7 C), temperature source Oral, resp. rate 18, height 5\' 6"  (1.676 m), weight 168 lb 14 oz (76.6 kg), last menstrual period 04/29/2012, SpO2 100.00%. Incision/Wound:soft abdomen clean dry intact  Disposition: Final discharge disposition not confirmed  Discharge Orders    Future Orders Please Complete By Expires   Diet - low sodium heart healthy      Increase activity slowly      Discharge instructions      Comments:   Shower today.. Drive when comfortable.  Regular diet.  OOW for 1 week.  Return to office in 2 - 3 weeks.  Dr Andrey Campanile  [phone].     Medication List  As of 05/21/2012  9:49 AM   TAKE these medications         naproxen sodium 220 MG tablet   Commonly known as: ANAPROX   Take 440 mg by mouth daily as needed. As needed for pain.      norethindrone 0.35 MG tablet   Commonly known as: MICRONOR,CAMILA,ERRIN   Take 1 tablet by mouth every evening.      oxyCODONE-acetaminophen 5-325 MG per tablet   Commonly known as: PERCOCET   Take 1-2 tablets by mouth every 4 (four) hours as needed.             Signed: Llewellyn Choplin A. 05/21/2012, 9:49 AM

## 2012-05-21 NOTE — OR Nursing (Signed)
Used of local during Laparoscopic Appendectomy. Nechama Guard, RN

## 2012-05-21 NOTE — Progress Notes (Signed)
Discharge instructions/Med Rec Sheet reviewed w/ pt. Pt expressed understanding and copies given w/ prescriptions. Pt ambulated off unit in stable condition, accompanied by her husband.

## 2012-05-21 NOTE — Discharge Instructions (Signed)
Laparoscopic Appendectomy Care After Refer to this sheet in the next few weeks. These instructions provide you with information on caring for yourself after your procedure. Your caregiver may also give you more specific instructions. Your treatment has been planned according to current medical practices, but problems sometimes occur. Call your caregiver if you have any problems or questions after your procedure. HOME CARE INSTRUCTIONS  Do not drive while taking narcotic pain medicines.   Use stool softener if you become constipated from your pain medicines.   Change your bandages (dressings) as directed.   Keep your wounds clean and dry. You may wash the wounds gently with soap and water. Gently pat the wounds dry with a clean towel.   Do not take baths, swim, or use hot tubs for 10 days, or as instructed by your caregiver.   Only take over-the-counter or prescription medicines for pain, discomfort, or fever as directed by your caregiver.   You may continue your normal diet as directed.   Do not lift more than 10 pounds (4.5 kg) or play contact sports for 3 weeks, or as directed.   Slowly increase your activity after surgery.   Take deep breaths to avoid getting a lung infection (pneumonia).  SEEK MEDICAL CARE IF:  You have redness, swelling, or increasing pain in your wounds.   You have pus coming from your wounds.   You have drainage from a wound that lasts longer than 1 day.   You notice a bad smell coming from the wounds or dressing.   Your wound edges break open after stitches (sutures) have been removed.   You notice increasing pain in the shoulders (shoulder strap areas) or near your shoulder blades.   You develop dizzy episodes or fainting while standing.   You develop shortness of breath.   You develop persistent nausea or vomiting.   You cannot control your bowel functions or lose your appetite.   You develop diarrhea.  SEEK IMMEDIATE MEDICAL CARE IF:   You  have a fever.   You develop a rash.   You have difficulty breathing or sharp pains in your chest.   You develop any reaction or side effects to medicines given.  MAKE SURE YOU:  Understand these instructions.   Will watch your condition.   Will get help right away if you are not doing well or get worse.  Document Released: 11/16/2005 Document Revised: 11/05/2011 Document Reviewed: 05/26/2011 [facility] Patient Information 2012 [place], [place].

## 2012-05-23 MED FILL — Bupivacaine Inj 0.25% w/ Epinephrine 1:200000 (PF): INTRAMUSCULAR | Qty: 30 | Status: AC

## 2012-05-23 NOTE — ED Provider Notes (Signed)
Medical screening examination/treatment/procedure(s) were performed by non-physician practitioner and as supervising physician I was immediately available for consultation/collaboration.   Gavin Pound. Oletta Lamas, MD 05/23/12 1849

## 2012-05-25 ENCOUNTER — Telehealth (INDEPENDENT_AMBULATORY_CARE_PROVIDER_SITE_OTHER): Payer: Self-pay | Admitting: General Surgery

## 2012-05-25 NOTE — Telephone Encounter (Signed)
Appt made for 06/08/12 @ 12:00 pm. Patient aware.

## 2012-05-25 NOTE — Telephone Encounter (Signed)
Message copied by Liliana Cline on Wed May 25, 2012  2:32 PM ------      Message from: Zacarias Pontes      Created: Wed May 25, 2012 12:06 PM       Pt needs po apt with Dr Andrey Campanile...appendy sx was 6/21.Marland KitchenMarland Kitchenpls call [phone] around lunch if possible

## 2012-05-26 ENCOUNTER — Encounter (HOSPITAL_COMMUNITY): Payer: Self-pay | Admitting: General Surgery

## 2012-06-08 ENCOUNTER — Ambulatory Visit (INDEPENDENT_AMBULATORY_CARE_PROVIDER_SITE_OTHER): Payer: Self-pay | Admitting: General Surgery

## 2012-06-08 ENCOUNTER — Encounter (INDEPENDENT_AMBULATORY_CARE_PROVIDER_SITE_OTHER): Payer: Self-pay | Admitting: General Surgery

## 2012-06-08 VITALS — BP 116/72 | HR 76 | Temp 99.6°F | Resp 16 | Ht 66.0 in | Wt 182.2 lb

## 2012-06-08 DIAGNOSIS — Z09 Encounter for follow-up examination after completed treatment for conditions other than malignant neoplasm: Secondary | ICD-10-CM

## 2012-06-08 NOTE — Progress Notes (Signed)
Subjective:     Patient ID: Caroline Jackson, female   DOB: 09/26/1986, 26 y.o.   MRN: [id number]  HPI 26 year old Caucasian female comes in today for a postoperative appointment. She underwent a laparoscopic appendectomy for acute appendicitis on June 21. She was discharged from the hospital on June 22. She has returned to work. She denies any fever, chills, nausea, vomiting, diarrhea, or constipation. She reports a good appetite. She reports a good energy level. She denies any problems with her incisions.  Review of Systems     Objective:   Physical Exam BP 116/72  Pulse 76  Temp 99.6 F (37.6 C)  Resp 16  Ht 5\' 6"  (1.676 m)  Wt 182 lb 3.2 oz (82.645 kg)  BMI 29.41 kg/m2  LMP 04/29/2012  Gen: alert, NAD, non-toxic appearing Abd: soft, nontender, nondistended. Well-healed trocar sites. No cellulitis. No incisional hernia    Assessment:     26 year old status post laparoscopic appendectomy for acute appendicitis    Plan:     We discussed her pathology report. She has been released to full activities. She appears to be doing quite well. Followup as needed.  Mary Sella. Andrey Campanile, MD, FACS General, Bariatric, & Minimally Invasive Surgery [facility], [place]

## 2012-06-08 NOTE — Patient Instructions (Signed)
You can resume full activities

## 2015-03-10 ENCOUNTER — Emergency Department
Admission: EM | Admit: 2015-03-10 | Discharge: 2015-03-10 | Disposition: A | Discharge status: Home / Self Care | Payer: 59 | Source: Home / Self Care | Attending: Emergency Medicine | Admitting: Emergency Medicine

## 2015-03-10 ENCOUNTER — Encounter: Payer: Self-pay | Admitting: Emergency Medicine

## 2015-03-10 ENCOUNTER — Emergency Department (INDEPENDENT_AMBULATORY_CARE_PROVIDER_SITE_OTHER): Payer: 59

## 2015-03-10 DIAGNOSIS — S300XXA Contusion of lower back and pelvis, initial encounter: Secondary | ICD-10-CM | POA: Diagnosis not present

## 2015-03-10 DIAGNOSIS — M545 Low back pain: Secondary | ICD-10-CM | POA: Diagnosis not present

## 2015-03-10 DIAGNOSIS — M533 Sacrococcygeal disorders, not elsewhere classified: Secondary | ICD-10-CM | POA: Diagnosis not present

## 2015-03-10 DIAGNOSIS — S3992XA Unspecified injury of lower back, initial encounter: Secondary | ICD-10-CM

## 2015-03-10 DIAGNOSIS — M79605 Pain in left leg: Secondary | ICD-10-CM | POA: Diagnosis not present

## 2015-03-10 DIAGNOSIS — M25552 Pain in left hip: Secondary | ICD-10-CM | POA: Diagnosis not present

## 2015-03-10 MED ORDER — HYDROCODONE-ACETAMINOPHEN 5-325 MG PO TABS: 1.0000 | ORAL_TABLET | Freq: Four times a day (QID) | ORAL | Status: DC | PRN

## 2015-03-10 MED ORDER — CYCLOBENZAPRINE HCL 5 MG PO TABS: ORAL_TABLET | ORAL | Status: DC

## 2015-03-10 MED ORDER — MELOXICAM 7.5 MG PO TABS: ORAL_TABLET | ORAL | Status: DC

## 2015-03-10 NOTE — ED Notes (Signed)
Yesterday fell down 4 steps and landed on tailbone; tried ibuprofen last night without relief; went to work today but very uncomfortable.

## 2015-03-10 NOTE — ED Provider Notes (Signed)
CSN: [id number]     Arrival date & time 03/10/15  1629 History   First MD Initiated Contact with Patient 03/10/15 1703     Chief Complaint  Patient presents with  . Tailbone Pain    HPI Yesterday fell down 4 steps and landed on tailbone; tried ibuprofen last night without relief; went to work today but very uncomfortable. Pain 7 out of 10. Sharp. The worst pain is right over the tailbone but also moderate to severe pain mid LS-spine and also moderate pain left hip. No radiation down the legs. Denies numbness or focal weakness or bowel or bladder dysfunction. Denies fever or chills or nausea or vomiting or cardiorespiratory symptoms. No headache or loss of consciousness. Past Medical History  Diagnosis Date  . Arthritis    Past Surgical History  Procedure Laterality Date  . Orthoscopic  left knee  . Esophagogastroduodenoscopy    . Laparoscopic appendectomy  05/20/2012    Procedure: APPENDECTOMY LAPAROSCOPIC;  Surgeon: [name], MD,FACS;  Location: [facility] OR;  Service: General;  Laterality: N/A;  . Appendectomy     History reviewed. No pertinent family history. History  Substance Use Topics  . Smoking status: Never Smoker   . Smokeless tobacco: Not on file  . Alcohol Use: Yes     Comment: social   OB History    No data available     Review of Systems Remainder of Review of Systems negative for acute change except as noted in the HPI.  Allergies  Latex  Home Medications   Prior to Admission medications   Medication Sig Start Date End Date Taking? Authorizing Provider  cyclobenzaprine (FLEXERIL) 5 MG tablet Take 1 or 2 every 8 hours as needed for muscle relaxant. Caution: May cause drowsiness. 03/10/15   Lajean Manes, MD  HYDROcodone-acetaminophen (NORCO/VICODIN) 5-325 MG per tablet Take 1-2 tablets by mouth every 6 (six) hours as needed for severe pain. Take with food. Caution: May cause drowsiness 03/10/15   Lajean Manes, MD  meloxicam (MOBIC) 7.5 MG tablet Take 1 twice a  day as needed for pain. Take with food. (Do not take with any other NSAID.) 03/10/15   Lajean Manes, MD  naproxen sodium (ANAPROX) 220 MG tablet Take 440 mg by mouth daily as needed. As needed for pain.    Historical Provider, MD  norethindrone (MICRONOR,CAMILA,ERRIN) 0.35 MG tablet Take 1 tablet by mouth every evening.    Historical Provider, MD   BP 121/84 mmHg  Pulse 85  Temp(Src) 97.9 F (36.6 C) (Oral)  Resp 16  Ht  (1.676 m)  Wt 146 lb (66.225 kg)  BMI 23.58 kg/m2  SpO2 100%  LMP 02/26/2015 Physical Exam  Constitutional: She is oriented to person, place, and time. She appears well-developed and well-nourished. No distress.    Uncomfortable from low back and coccyx and left hip pain. Gait antalgic. She walks slowly.  HENT:  Head: Normocephalic and atraumatic.  Eyes: Conjunctivae and EOM are normal. Pupils are equal, round, and reactive to light. No scleral icterus.  Neck: Normal range of motion.  Cardiovascular: Normal rate.   Pulmonary/Chest: Effort normal.  Abdominal: She exhibits no distension.  Musculoskeletal: Normal range of motion.       Right hip: Normal. She exhibits normal range of motion, no tenderness and no bony tenderness.       Left hip: She exhibits bony tenderness.  LS-spine: Diffuse point tenderness directly over L3 L4 L5 and S1 and exquisitely tender over her coccyx. No  ecchymosis. Also tender, spasm bilateral paralumbar muscles exacerbated by twisting. Range of motion is decreased.  Left hip: Moderate tenderness diffusely. Range of motion intact but pain upon internal and external rotation. No ecchymosis  Lower extremities: Nontender. Neurovascular distally intact.   Neurological: She is alert and oriented to person, place, and time.  Motor, sensory, DTRs equal and intact bilaterally of upper and lower extremities  Skin: Skin is warm. No bruising, no laceration and no rash noted. She is not diaphoretic.  Psychiatric: She has a normal mood and  affect.  Nursing note and vitals reviewed.   ED Course  Procedures (including critical care time) Labs Review Labs Reviewed - No data to display  Imaging Review Dg Lumbar Spine Complete  03/10/2015   CLINICAL DATA:  LEFT hip pain. LEFT lumbago/low back pain. Pain radiating into the LEFT lower extremity.  EXAM: LUMBAR SPINE - COMPLETE 4+ VIEW  COMPARISON:  None.  FINDINGS: There is no evidence of lumbar spine fracture. Alignment is normal. Intervertebral disc spaces are maintained.  IMPRESSION: Negative.   Electronically Signed   By: Andreas Newport M.D.   On: 03/10/2015 18:20   Dg Sacrum/coccyx  03/10/2015   CLINICAL DATA:  Larey Seat down stairs 1 day prior, now with sacrum and coccyx pain.  EXAM: SACRUM AND COCCYX - 2+ VIEW  COMPARISON:  None.  FINDINGS: Cortical margins of the sacrum and coccyx are intact. The sacral alae are maintained. The sacroiliac joints are congruent. There is no fracture.  IMPRESSION: No fracture of the sacrum or coccyx.   Electronically Signed   By: Rubye Oaks M.D.   On: 03/10/2015 18:23   Dg Hip Unilat With Pelvis 1v Left  03/10/2015   CLINICAL DATA:  Fall down stairs 1 day prior, now with sacrum, coccyx, and left hip pain.  EXAM: LEFT HIP (WITH PELVIS) 1 VIEW  COMPARISON:  None.  FINDINGS: The cortical margins of the bony pelvis and left hip are intact. No fracture. Pubic symphysis and sacroiliac joints are congruent. Both femoral heads are well-seated in the respective acetabula.  IMPRESSION: No fracture or subluxation of the pelvis or left hip.   Electronically Signed   By: Rubye Oaks M.D.   On: 03/10/2015 18:22     MDM   1. Contusion of coccyx, initial encounter   2. Lower back injury, initial encounter   3. Hip pain, acute, left   4. Lumbar contusion, initial encounter    Reviewed with patient that x-rays LS-spine, coccyx, and left hip/pelvis were all within normal limits. No fracture or subluxation. Treatment options discussed, as well as risks,  benefits, alternatives. Patient voiced understanding and agreement with the following plans:  New Prescriptions   CYCLOBENZAPRINE (FLEXERIL) 5 MG TABLET    Take 1 or 2 every 8 hours as needed for muscle relaxant. Caution: May cause drowsiness.   HYDROCODONE-ACETAMINOPHEN (NORCO/VICODIN) 5-325 MG PER TABLET    Take 1-2 tablets by mouth every 6 (six) hours as needed for severe pain. Take with food. Caution: May cause drowsiness   MELOXICAM (MOBIC) 7.5 MG TABLET    Take 1 twice a day as needed for pain. Take with food. (Do not take with any other NSAID.)   wrote a note excusing her from work 03/11/15-03/12/15, may return to work 03/13/2015 Handouts and instruction sheets given.  Ice for another 24 hours. Then may try heat. May purchase special doughnut pillow to avoid pressure on coccyx area when trying to sit. Questions invited and answered. Follow-up with  PCP or orthopedist if no better one week, sooner if worse or new symptoms. Red flags discussed.-Emergency room if any red flag She voiced understanding and agreement with the above plans   Lajean Manesavid Massey, MD 03/10/15 1836

## 2015-10-12 ENCOUNTER — Encounter (HOSPITAL_COMMUNITY)
Admission: AD | Disposition: A | Discharge status: Home / Self Care | Payer: Self-pay | Source: Ambulatory Visit | Attending: Family Medicine

## 2015-10-12 ENCOUNTER — Ambulatory Visit (HOSPITAL_COMMUNITY)
Admission: AD | Admit: 2015-10-12 | Discharge: 2015-10-12 | Disposition: A | Discharge status: Home / Self Care | Payer: 59 | Source: Ambulatory Visit | Attending: Family Medicine | Admitting: Family Medicine

## 2015-10-12 ENCOUNTER — Encounter (HOSPITAL_COMMUNITY): Payer: Self-pay | Admitting: Anesthesiology

## 2015-10-12 ENCOUNTER — Inpatient Hospital Stay (HOSPITAL_COMMUNITY): Payer: 59 | Admitting: Anesthesiology

## 2015-10-12 ENCOUNTER — Encounter (HOSPITAL_COMMUNITY): Payer: Self-pay | Admitting: *Deleted

## 2015-10-12 DIAGNOSIS — S3141XA Laceration without foreign body of vagina and vulva, initial encounter: Secondary | ICD-10-CM | POA: Insufficient documentation

## 2015-10-12 DIAGNOSIS — Y9389 Activity, other specified: Secondary | ICD-10-CM | POA: Diagnosis not present

## 2015-10-12 DIAGNOSIS — D62 Acute posthemorrhagic anemia: Secondary | ICD-10-CM | POA: Diagnosis present

## 2015-10-12 DIAGNOSIS — S3140XA Unspecified open wound of vagina and vulva, initial encounter: Secondary | ICD-10-CM

## 2015-10-12 DIAGNOSIS — Y92019 Unspecified place in single-family (private) house as the place of occurrence of the external cause: Secondary | ICD-10-CM

## 2015-10-12 DIAGNOSIS — N939 Abnormal uterine and vaginal bleeding, unspecified: Secondary | ICD-10-CM | POA: Diagnosis present

## 2015-10-12 DIAGNOSIS — N930 Postcoital and contact bleeding: Secondary | ICD-10-CM

## 2015-10-12 DIAGNOSIS — X58XXXA Exposure to other specified factors, initial encounter: Secondary | ICD-10-CM

## 2015-10-12 HISTORY — PX: PERINEAL LACERATION REPAIR: SHX5389

## 2015-10-12 LAB — CBC
HCT: 30.6 % — ABNORMAL LOW (ref 36.0–46.0)
Hemoglobin: 10.3 g/dL — ABNORMAL LOW (ref 12.0–15.0)
MCH: 32.3 pg (ref 26.0–34.0)
MCHC: 33.7 g/dL (ref 30.0–36.0)
MCV: 95.9 fL (ref 78.0–100.0)
Platelets: 275 10*3/uL (ref 150–400)
RBC: 3.19 MIL/uL — ABNORMAL LOW (ref 3.87–5.11)
RDW: 13 % (ref 11.5–15.5)
WBC: 18.7 10*3/uL — ABNORMAL HIGH (ref 4.0–10.5)

## 2015-10-12 LAB — TYPE AND SCREEN
ABO/RH(D): O POS
Antibody Screen: NEGATIVE

## 2015-10-12 LAB — ABO/RH: ABO/RH(D): O POS

## 2015-10-12 SURGERY — SUTURE REPAIR, LACERATION, PERINEUM
Anesthesia: General | Site: Vagina

## 2015-10-12 MED ORDER — MORPHINE SULFATE (PF) 4 MG/ML IV SOLN
1.0000 mg | Freq: Once | INTRAVENOUS | Status: AC
Start: 2015-10-12 — End: 2015-10-12
  Administered 2015-10-12: 1 mg via INTRAVENOUS
  Filled 2015-10-12: qty 1

## 2015-10-12 MED ORDER — MIDAZOLAM HCL 5 MG/5ML IJ SOLN
INTRAMUSCULAR | Status: DC | PRN
  Administered 2015-10-12: 2 mg via INTRAVENOUS

## 2015-10-12 MED ORDER — FENTANYL CITRATE (PF) 100 MCG/2ML IJ SOLN
INTRAMUSCULAR | Status: AC
  Filled 2015-10-12: qty 4

## 2015-10-12 MED ORDER — OXYCODONE-ACETAMINOPHEN 5-325 MG PO TABS: 1.0000 | ORAL_TABLET | Freq: Four times a day (QID) | ORAL | Status: AC | PRN

## 2015-10-12 MED ORDER — KETOROLAC TROMETHAMINE 30 MG/ML IJ SOLN
INTRAMUSCULAR | Status: AC
Start: 2015-10-12 — End: 2015-10-12
  Filled 2015-10-12: qty 2

## 2015-10-12 MED ORDER — LIDOCAINE HCL (CARDIAC) 20 MG/ML IV SOLN
INTRAVENOUS | Status: AC
  Filled 2015-10-12: qty 5

## 2015-10-12 MED ORDER — KETOROLAC TROMETHAMINE 30 MG/ML IJ SOLN
INTRAMUSCULAR | Status: DC | PRN
  Administered 2015-10-12 (×2): 30 mg via INTRAVENOUS

## 2015-10-12 MED ORDER — PROPOFOL 10 MG/ML IV BOLUS
INTRAVENOUS | Status: AC
  Filled 2015-10-12: qty 20

## 2015-10-12 MED ORDER — FAMOTIDINE IN NACL 20-0.9 MG/50ML-% IV SOLN
20.0000 mg | Freq: Once | INTRAVENOUS | Status: AC
Start: 2015-10-12 — End: 2015-10-12
  Administered 2015-10-12 (×2): 20 mg via INTRAVENOUS
  Filled 2015-10-12: qty 50

## 2015-10-12 MED ORDER — FENTANYL CITRATE (PF) 100 MCG/2ML IJ SOLN
INTRAMUSCULAR | Status: DC | PRN
  Administered 2015-10-12: 100 ug via INTRAVENOUS

## 2015-10-12 MED ORDER — ONDANSETRON HCL 4 MG/2ML IJ SOLN
INTRAMUSCULAR | Status: DC | PRN
  Administered 2015-10-12: 4 mg via INTRAVENOUS

## 2015-10-12 MED ORDER — CITRIC ACID-SODIUM CITRATE 334-500 MG/5ML PO SOLN
30.0000 mL | Freq: Once | ORAL | Status: AC
Start: 2015-10-12 — End: 2015-10-12
  Administered 2015-10-12: 30 mL via ORAL
  Filled 2015-10-12: qty 15

## 2015-10-12 MED ORDER — LACTATED RINGERS IV SOLN
INTRAVENOUS | Status: DC
  Administered 2015-10-12 (×2): via INTRAVENOUS

## 2015-10-12 MED ORDER — LIDOCAINE HCL (CARDIAC) 20 MG/ML IV SOLN
INTRAVENOUS | Status: DC | PRN
  Administered 2015-10-12 (×2): 50 mg via INTRAVENOUS

## 2015-10-12 MED ORDER — DEXAMETHASONE SODIUM PHOSPHATE 4 MG/ML IJ SOLN
INTRAMUSCULAR | Status: DC | PRN
  Administered 2015-10-12: 4 mg via INTRAVENOUS

## 2015-10-12 MED ORDER — PROPOFOL 10 MG/ML IV BOLUS
INTRAVENOUS | Status: DC | PRN
  Administered 2015-10-12: 50 mg via INTRAVENOUS
  Administered 2015-10-12: 150 mg via INTRAVENOUS

## 2015-10-12 MED ORDER — ONDANSETRON HCL 4 MG/2ML IJ SOLN
INTRAMUSCULAR | Status: AC
  Filled 2015-10-12: qty 2

## 2015-10-12 MED ORDER — MIDAZOLAM HCL 2 MG/2ML IJ SOLN
INTRAMUSCULAR | Status: AC
  Filled 2015-10-12: qty 4

## 2015-10-12 MED ORDER — DEXAMETHASONE SODIUM PHOSPHATE 4 MG/ML IJ SOLN
INTRAMUSCULAR | Status: AC
Start: 2015-10-12 — End: 2015-10-12
  Filled 2015-10-12: qty 1

## 2015-10-12 SURGICAL SUPPLY — 10 items
BLADE SURG 15 STRL LF C SS BP (BLADE) IMPLANT
BLADE SURG 15 STRL SS (BLADE)
CLOTH BEACON ORANGE TIMEOUT ST (SAFETY) ×2 IMPLANT
GLOVE BIOGEL PI IND STRL 7.0 (GLOVE) ×1 IMPLANT
GLOVE BIOGEL PI INDICATOR 7.0 (GLOVE) ×1
GLOVE SURG SS PI 7.0 STRL IVOR (GLOVE) ×8 IMPLANT
PACK VAGINAL MINOR WOMEN LF (CUSTOM PROCEDURE TRAY) ×2 IMPLANT
PAD PREP 24X48 CUFFED NSTRL (MISCELLANEOUS) ×2 IMPLANT
SUT CHROMIC 2 0 CT 1 (SUTURE) ×4 IMPLANT
TOWEL OR 17X24 6PK STRL BLUE (TOWEL DISPOSABLE) ×2 IMPLANT

## 2015-10-12 NOTE — Transfer of Care (Signed)
Immediate Anesthesia Transfer of Care Note  Patient: Caroline Jackson  Procedure(s) Performed: Procedure(s): SUTURE REPAIR PERINEAL LACERATION (N/A)  Patient Location: PACU  Anesthesia Type:General  Level of Consciousness: awake, alert  and oriented  Airway & Oxygen Therapy: Patient Spontanous Breathing and Patient connected to nasal cannula oxygen  Post-op Assessment: Report given to RN and Post -op Vital signs reviewed and stable  Post vital signs: Reviewed and stable  Last Vitals:  Filed Vitals:   10/12/15 1633  BP: 114/75  Pulse: 100  Temp: 36.7 C  Resp: 18    Complications: No apparent anesthesia complications

## 2015-10-12 NOTE — H&P (Addendum)
Caroline Jackson is an 29 y.o. G0P0000 female.    Chief Complaint: vaginal bleeding   HPI: Patient had intercourse for first time in 1 year and began having pain and bleeding.  Intercourse was normal penile without toys or use of hands. She has been bleeding since 3 am.  She does not know nor can she quantify how much blood has been lost. She has been syncopal and passed out twice.  Past Medical History  Diagnosis Date  . Arthritis     Past Surgical History  Procedure Laterality Date  . Orthoscopic  left knee  . Esophagogastroduodenoscopy    . Laparoscopic appendectomy  05/20/2012    Procedure: APPENDECTOMY LAPAROSCOPIC;  Surgeon: [name], MD,FACS;  Location: [facility] OR;  Service: General;  Laterality: N/A;  . Appendectomy     Social History:  reports that she has never smoked. She does not have any smokeless tobacco history on file. She reports that she drinks alcohol. She reports that she does not use illicit drugs.   Allergies  Allergen Reactions  . Latex Rash    No current facility-administered medications on file prior to encounter.   Current Outpatient Prescriptions on File Prior to Encounter  Medication Sig Dispense Refill  . cyclobenzaprine (FLEXERIL) 5 MG tablet Take 1 or 2 every 8 hours as needed for muscle relaxant. Caution: May cause drowsiness. 20 tablet 0  . HYDROcodone-acetaminophen (NORCO/VICODIN) 5-325 MG per tablet Take 1-2 tablets by mouth every 6 (six) hours as needed for severe pain. Take with food. Caution: May cause drowsiness 12 tablet 0  . meloxicam (MOBIC) 7.5 MG tablet Take 1 twice a day as needed for pain. Take with food. (Do not take with any other NSAID.) 20 tablet 0  . naproxen sodium (ANAPROX) 220 MG tablet Take 440 mg by mouth daily as needed. As needed for pain.    Marland Kitchen norethindrone (MICRONOR,CAMILA,ERRIN) 0.35 MG tablet Take 1 tablet by mouth every evening.      A comprehensive review of systems was negative.  Blood pressure 114/75, pulse 100,  temperature 98 F (36.7 C), temperature source Oral, resp. rate 18. BP 114/75 mmHg  Pulse 100  Temp(Src) 98 F (36.7 C) (Oral)  Resp 18 General appearance: alert, cooperative and appears stated age Neck: supple, symmetrical, trachea midline Lungs: clear to auscultation bilaterally Breasts: normal appearance, no masses or tenderness Heart: regular rate and rhythm Abdomen: soft, non-tender; bowel sounds normal; no masses,  no organomegaly Pelvic: cervix normal in appearance, external genitalia normal and there is a large clot removed from the vaginal vault, there continues to be fresh blood but no active hemorrhage. Extremities: extremities normal, atraumatic, no cyanosis or edema Skin: Skin color, texture, turgor normal. No rashes or lesions Neurologic: Grossly normal  CBC    Component Value Date/Time   WBC 18.7* 10/12/2015 1658   RBC 3.19* 10/12/2015 1658   HGB 10.3* 10/12/2015 1658   HCT 30.6* 10/12/2015 1658   PLT 275 10/12/2015 1658   MCV 95.9 10/12/2015 1658   MCH 32.3 10/12/2015 1658   MCHC 33.7 10/12/2015 1658   RDW 13.0 10/12/2015 1658   LYMPHSABS 1.5 05/20/2012 1413   MONOABS 0.7 05/20/2012 1413   EOSABS 0.1 05/20/2012 1413   BASOSABS 0.0 05/20/2012 1413     Assessment/Plan Principal Problem:   Vaginal laceration  Will likely need surgical repair to stop bleeding.  Will check CBC, Type and screen and move to OR. Patient had a bite of sandwich at 2 pm. If  blood count is stable, may hold off until NPO status is more favorable. Blood as needed. Risks include but are not limited to bleeding, infection, injury to surrounding structures..  Likelihood of success is high.    Kenwood Rosiak S 10/12/2015, 4:44 PM

## 2015-10-12 NOTE — Discharge Instructions (Signed)
Vaginal Laceration °A vaginal laceration is a tear in the vaginal wall. A vaginal tear falls into one of three categories:  °1. Obstetrically related tears that occur at the time of childbirth. °2. Trauma-related tears (most often related to sexual intercourse). °3. Spontaneous tears. °Vaginal tears can cause heavy bleeding (hemorrhaging) depending on severity of the tear. Tears can be intensely tender and interfere with normal activities of living. They can make sexual intercourse painful and bring on significant burning with urination. If you have a vaginal laceration, the area around your vagina may be painful when you touch or wipe it. Even light pressure from clothing may cause some pain. Vaginal tear need to be evaluated by your caregiver.   °CAUSES  °· Obstetric-related causes, such as childbirth. °· Trauma that may result from an accident during an activity, such as sexual intercourse or a bicycle ride. °· Spontaneous causes related to aging, failed healing of a past obstetric tear, chronic irritation, or skin changes that are not well understood. °SYMPTOMS  °· Slight to heavy vaginal bleeding. °· Vaginal swelling. °· Mild to severe pain. °· Vaginal tenderness. °DIAGNOSIS  °If the tear happened during childbirth, your caregiver can diagnose the tear at that time. To diagnose a vaginal tear that happened spontaneously or because of trauma, your caregiver will perform a physical exam. During the physical exam, your caregiver may also look for any signs of trouble that may need further testing. If there is hemorrhaging, your caregiver may suggest blood tests to determine the extent of bleeding. Imaging tests may be performed, such as an ultrasonography or computed tomography (CT), to look for internal damage. A biopsy may be need if there are signs of a more serious problem.  °TREATMENT  °Treatment depends on the severity of the tear. For minor tears that heal on their own, treatment may only consist of keeping  the area clean and dry. Some tears need to be repaired with stitches. Other tears may heal on their own with help from various remedies, such as antibiotic ointments, medicated creams, or petroleum products. Depending on the circumstances, oral hormones may also be suggested. Hormone remedies may also be in the form of topical creams and vaginal tablets. For more concerning situations, hospitalization and surgical repair of the tear may be needed. °HOME CARE INSTRUCTIONS  °· Take warm-water baths that cover your hips and buttocks (sitz bath) 2 to 3 times a day. This may help any discomfort and swelling.   °· Only take over-the-counter or prescription medicines for pain, discomfort, or fever as directed by your caregiver. Do not use aspirin because it can cause increased bleeding.   °· Do not douche, use tampons, or have intercourse until your caregiver says it is okay.    °· A bandage (dressing) may have been applied. Change the dressing once a day or as directed. If the dressing sticks, soak it off with warm, soapy water.   °· Apply ice or witch hazel pads to the vagina to lessen any pain or discomfort.   °· Take a stool softener or follow a special diet as directed by your caregiver. This will help ease discomfort associated with bowel movements.   °SEEK IMMEDIATE MEDICAL CARE IF:  °· You have redness or swelling in the vaginal area.   °· You have increasing, sharp, or intense pain or tenderness in the vaginal area. °· You have pus or unusual discharge coming from the tear or vagina.   °· You notice a bad smell coming from the vagina.   °· Your tear breaks open after   it healed or was repaired.   °· You feel lightheaded. °· You have increasing abdominal pain.   °· You have an increasing or heavy amount of vaginal bleeding.   °· You have pain with intercourse after the tear heals.   °MAKE SURE YOU: °· Understand these instructions. °· Will watch your condition. °· Will get help right away if you are not doing well  or get worse. °  °This information is not intended to replace advice given to you by your health care provider. Make sure you discuss any questions you have with your health care provider. °  °Document Released: 11/16/2005 Document Revised: 08/10/2012 Document Reviewed: 04/04/2012 °Elsevier Interactive Patient Education ©2016 [employer] ° °

## 2015-10-12 NOTE — Anesthesia Preprocedure Evaluation (Addendum)
Anesthesia Evaluation  Patient identified by MRN, date of birth, ID band Patient awake    Reviewed: Allergy & Precautions, NPO status , Patient's Chart, lab work & pertinent test results  History of Anesthesia Complications Negative for: history of anesthetic complications  Airway Mallampati: I  TM Distance: >3 FB Neck ROM: Full    Dental no notable dental hx. (+) Dental Advisory Given   Pulmonary neg pulmonary ROS,    Pulmonary exam normal        Cardiovascular negative cardio ROS Normal cardiovascular exam     Neuro/Psych negative neurological ROS  negative psych ROS   GI/Hepatic negative GI ROS, Neg liver ROS,   Endo/Other  negative endocrine ROS  Renal/GU negative Renal ROS  negative genitourinary   Musculoskeletal   Abdominal Normal abdominal exam  (+)   Peds negative pediatric ROS (+)  Hematology negative hematology ROS (+)   Anesthesia Other Findings   Reproductive/Obstetrics negative OB ROS                            Anesthesia Physical Anesthesia Plan  ASA: I  Anesthesia Plan: General   Post-op Pain Management:    Induction: Intravenous  Airway Management Planned: LMA  Additional Equipment:   Intra-op Plan:   Post-operative Plan:   Informed Consent: I have reviewed the patients History and Physical, chart, labs and discussed the procedure including the risks, benefits and alternatives for the proposed anesthesia with the patient or authorized representative who has indicated his/her understanding and acceptance.   Dental advisory given  Plan Discussed with: CRNA and Surgeon  Anesthesia Plan Comments:        Anesthesia Quick Evaluation

## 2015-10-12 NOTE — MAU Note (Signed)
Had intercourse last night with a new partner( had not had it for about a year). Started bleeding afterwards. Got heavy and stated she paseed out 2 times earlier this morning. Went home and to bed went to urgent care still bleeidng heavy.

## 2015-10-12 NOTE — Anesthesia Procedure Notes (Signed)
Procedure Name: LMA Insertion Date/Time: 10/12/2015 8:06 PM Performed by: Janeece AgeeWRAPE, Severa Jeremiah W Pre-anesthesia Checklist: Patient identified, Emergency Drugs available, Suction available, Patient being monitored and Timeout performed Patient Re-evaluated:Patient Re-evaluated prior to inductionOxygen Delivery Method: Circle system utilized and Simple face mask Preoxygenation: Pre-oxygenation with 100% oxygen Intubation Type: IV induction LMA: LMA inserted LMA Size: 4.0 Grade View: Grade I Number of attempts: 1 Placement Confirmation: positive ETCO2 and breath sounds checked- equal and bilateral

## 2015-10-12 NOTE — Anesthesia Postprocedure Evaluation (Signed)
Anesthesia Post Note  Patient: Caroline Jackson  Procedure(s) Performed: Procedure(s) (LRB): SUTURE REPAIR PERINEAL LACERATION (N/A)  Anesthesia type: General  Patient location: PACU  Post pain: Pain level controlled  Post assessment: Post-op Vital signs reviewed  Last Vitals:  Filed Vitals:   10/12/15 2100  BP: 109/61  Pulse: 80  Temp: 37.1 C  Resp: 18    Post vital signs: Reviewed  Level of consciousness: sedated  Complications: No apparent anesthesia complications

## 2015-10-12 NOTE — Op Note (Signed)
Preoperative diagnosis: vaginal laceration  Postoperative diagnosis: Same  Procedure: Exam under anesthesia, repair of vaginal laceration  Surgeon: Shelbie Proctoranya S. Shawnie PonsPratt, M.D.  Anesthesia: MAC, Leilani AbleFranklin Hatchett, MD, MD  Findings: 5 cm laceration peri-cervically and post cervically  EBL: 100 cc  Reason for procedure: Patient is a 29 y.o. G0P0000 who is here following intercourse which resulted in profuse vaginal bleeding. Now with acute blood loss anemia and persistent bleeding needing definitive repair.  Procedure: Patient was placed in dorsal lithotomy after spinal analgesia. A  catheter was used to drain an unmeasured amount of urine. Time out performed. SCD's in place. Vagina was prepped. Speculum placed inside the vagina.  Laceration noted as above. Edges grasped with Alis clamps and 2-0 chromic used to close laceration in a locked running fashion. Further bleeding noted at the superior border and 2 figure of eights were needed to ensure hemostasis. This concluded the procedure. Patient tolerated the procedure well. All instrument, needle, and lap counts were correct x 2.  Reva BoresPRATT,Emanuelle Hammerstrom S, MD 10/12/2015, 8:41 PM

## 2015-10-13 ENCOUNTER — Encounter (HOSPITAL_COMMUNITY): Payer: Self-pay | Admitting: Family Medicine

## 2015-10-16 ENCOUNTER — Telehealth: Payer: Self-pay | Admitting: General Practice

## 2015-10-16 ENCOUNTER — Inpatient Hospital Stay (HOSPITAL_COMMUNITY)
Admission: AD | Admit: 2015-10-16 | Discharge: 2015-10-16 | Disposition: A | Discharge status: Home / Self Care | Payer: 59 | Source: Ambulatory Visit | Attending: Obstetrics & Gynecology | Admitting: Obstetrics & Gynecology

## 2015-10-16 ENCOUNTER — Encounter (HOSPITAL_COMMUNITY): Payer: Self-pay | Admitting: *Deleted

## 2015-10-16 DIAGNOSIS — N939 Abnormal uterine and vaginal bleeding, unspecified: Secondary | ICD-10-CM

## 2015-10-16 DIAGNOSIS — N99820 Postprocedural hemorrhage and hematoma of a genitourinary system organ or structure following a genitourinary system procedure: Secondary | ICD-10-CM | POA: Diagnosis not present

## 2015-10-16 DIAGNOSIS — IMO0002 Reserved for concepts with insufficient information to code with codable children: Secondary | ICD-10-CM | POA: Diagnosis present

## 2015-10-16 LAB — URINALYSIS, ROUTINE W REFLEX MICROSCOPIC
Bilirubin Urine: NEGATIVE
Glucose, UA: NEGATIVE mg/dL
Ketones, ur: NEGATIVE mg/dL
Leukocytes, UA: NEGATIVE
Nitrite: NEGATIVE
Protein, ur: NEGATIVE mg/dL
Specific Gravity, Urine: <1.005 — ABNORMAL LOW (ref 1.005–1.030)
pH: 5.5 (ref 5.0–8.0)

## 2015-10-16 LAB — CBC
HCT: 29.7 % — ABNORMAL LOW (ref 36.0–46.0)
Hemoglobin: 10.1 g/dL — ABNORMAL LOW (ref 12.0–15.0)
MCH: 32.6 pg (ref 26.0–34.0)
MCHC: 34 g/dL (ref 30.0–36.0)
MCV: 95.8 fL (ref 78.0–100.0)
Platelets: 298 10*3/uL (ref 150–400)
RBC: 3.1 MIL/uL — ABNORMAL LOW (ref 3.87–5.11)
RDW: 12.9 % (ref 11.5–15.5)
WBC: 6.9 10*3/uL (ref 4.0–10.5)

## 2015-10-16 LAB — URINE MICROSCOPIC-ADD ON

## 2015-10-16 LAB — POCT PREGNANCY, URINE: Preg Test, Ur: NEGATIVE

## 2015-10-16 NOTE — MAU Note (Addendum)
Pt had vaginal laceration repaired on Saturday.  Bleeding stopped yesterday morning, began bleeding again last night like a period, continues to have bleeding today.  Has pressure, not really pain.  C/O urinary frequency & burning, took AZO yesterday.

## 2015-10-16 NOTE — Discharge Instructions (Signed)
Safe Sex  Safe sex is about reducing the risk of giving or getting a sexually transmitted disease (STD). STDs are spread through sexual contact involving the genitals, mouth, or rectum. Some STDs can be cured and others cannot. Safe sex can also prevent unintended pregnancies.   WHAT ARE SOME SAFE SEX PRACTICES?  · Limit your sexual activity to only one partner who is having sex with only you.  · Talk to your partner about his or her past partners, past STDs, and drug use.  · Use a condom every time you have sexual intercourse. This includes vaginal, oral, and anal sexual activity. Both females and males should wear condoms during oral sex. Only use latex or polyurethane condoms and water-based lubricants. Using petroleum-based lubricants or oils to lubricate a condom will weaken the condom and increase the chance that it will break. The condom should be in place from the beginning to the end of sexual activity. Wearing a condom reduces, but does not completely eliminate, your risk of getting or giving an STD. STDs can be spread by contact with infected body fluids and skin.  · Get vaccinated for hepatitis B and HPV.  · Avoid alcohol and recreational drugs, which can affect your judgment. You may forget to use a condom or participate in high-risk sex.  · For females, avoid douching after sexual intercourse. Douching can spread an infection farther into the reproductive tract.  · Check your body for signs of sores, blisters, rashes, or unusual discharge. See your health care provider if you notice any of these signs.  · Avoid sexual contact if you have symptoms of an infection or are being treated for an STD. If you or your partner has herpes, avoid sexual contact when blisters are present. Use condoms at all other times.  · If you are at risk of being infected with HIV, it is recommended that you take a prescription medicine daily to prevent HIV infection. This is called pre-exposure prophylaxis (PrEP). You are  considered at risk if:    You are a man who has sex with other men (MSM).    You are a heterosexual man or woman who is sexually active with more than one partner.    You take drugs by injection.    You are sexually active with a partner who has HIV.  · Talk with your health care provider about whether you are at high risk of being infected with HIV. If you choose to begin PrEP, you should first be tested for HIV. You should then be tested every 3 months for as long as you are taking PrEP.  · See your health care provider for regular screenings, exams, and tests for other STDs. Before having sex with a new partner, each of you should be screened for STDs and should talk about the results with each other.  WHAT ARE THE BENEFITS OF SAFE SEX?   · There is less chance of getting or giving an STD.  · You can prevent unwanted or unintended pregnancies.  · By discussing safe sex concerns with your partner, you may increase feelings of intimacy, comfort, trust, and honesty between the two of you.     This information is not intended to replace advice given to you by your health care provider. Make sure you discuss any questions you have with your health care provider.     Document Released: 12/24/2004 Document Revised: 12/07/2014 Document Reviewed: 05/09/2012  Elsevier Interactive Patient Education ©2016 [employer]

## 2015-10-16 NOTE — MAU Provider Note (Signed)
History     CSN: [id number]  Arrival date and time: 10/16/15 1158   First Provider Initiated Contact with Patient 10/16/15 1326      Chief Complaint  Patient presents with  . Vaginal Bleeding   HPI  Caroline Jackson 29 y.o. G0P0000 presents to MAU after having a cervical laceration repaired  4 days ago. She states that she stopped bleeding yesterday but then started again today and was concerned.  Past Medical History  Diagnosis Date  . Arthritis     Past Surgical History  Procedure Laterality Date  . Orthoscopic  left knee  . Esophagogastroduodenoscopy    . Laparoscopic appendectomy  05/20/2012    Procedure: APPENDECTOMY LAPAROSCOPIC;  Surgeon: [name], MD,FACS;  Location: [facility];  Service: General;  Laterality: N/A;  . Appendectomy    . Perineal laceration repair N/A 10/12/2015    Procedure: SUTURE REPAIR PERINEAL LACERATION;  Surgeon: [name], MD;  Location: [facility];  Service: Gynecology;  Laterality: N/A;  . Knee surgery      x 2 left knee, orthoscopic     Family History  Problem Relation Age of Onset  . Arthritis Mother   . Hypertension Father   . Diabetes Maternal Grandmother     Social History  Substance Use Topics  . Smoking status: Never Smoker   . Smokeless tobacco: None  . Alcohol Use: Yes     Comment: social    Allergies:  Allergies  Allergen Reactions  . Latex Rash    Prescriptions prior to admission  Medication Sig Dispense Refill Last Dose  . acetaminophen (TYLENOL) 325 MG tablet Take 650 mg by mouth every 6 (six) hours as needed for mild pain.   two weeks  . ibuprofen (ADVIL,MOTRIN) 200 MG tablet Take 800 mg by mouth every 6 (six) hours as needed for moderate pain.   Past Week at Unknown time  . oxyCODONE-acetaminophen (PERCOCET/ROXICET) 5-325 MG tablet Take 1-2 tablets by mouth every 6 (six) hours as needed. 10 tablet 0     Review of Systems  Constitutional: Negative for fever.  Genitourinary:       Vaginal bleeding  All  other systems reviewed and are negative.  Physical Exam   Blood pressure 111/72, pulse 83, temperature 98 F (36.7 C), temperature source Oral, resp. rate 18, last menstrual period 09/27/2015.  Physical Exam  Nursing note and vitals reviewed. Constitutional: She is oriented to person, place, and time. She appears well-developed and well-nourished. No distress.  HENT:  Head: Normocephalic and atraumatic.  Neck: Normal range of motion.  Cardiovascular: Normal rate.   Respiratory: Effort normal. No respiratory distress.  GI: Soft. There is no tenderness.  Genitourinary: Vagina normal.  Easy spec; small amount bleeding noted in vault  Musculoskeletal: Normal range of motion.  Neurological: She is alert and oriented to person, place, and time.  Skin: Skin is warm and dry.  Psychiatric: She has a normal mood and affect. Her behavior is normal. Judgment and thought content normal.   Results for orders placed or performed during the hospital encounter of 10/16/15 (from the past 24 hour(s))  Urinalysis, Routine w reflex microscopic (not at [facility])     Status: Abnormal   Collection Time: 10/16/15 12:10 PM  Result Value Ref Range   Color, Urine STRAW (A) YELLOW   APPearance CLEAR CLEAR   Specific Gravity, Urine <1.005 (L) 1.005 - 1.030   pH 5.5 5.0 - 8.0   Glucose, UA NEGATIVE NEGATIVE mg/dL  Hgb urine dipstick MODERATE (A) NEGATIVE   Bilirubin Urine NEGATIVE NEGATIVE   Ketones, ur NEGATIVE NEGATIVE mg/dL   Protein, ur NEGATIVE NEGATIVE mg/dL   Nitrite NEGATIVE NEGATIVE   Leukocytes, UA NEGATIVE NEGATIVE  Urine microscopic-add on     Status: Abnormal   Collection Time: 10/16/15 12:10 PM  Result Value Ref Range   Squamous Epithelial / LPF 0-5 (A) NONE SEEN   WBC, UA 0-5 0 - 5 WBC/hpf   RBC / HPF 0-5 0 - 5 RBC/hpf   Bacteria, UA RARE (A) NONE SEEN  Pregnancy, urine POC     Status: None   Collection Time: 10/16/15 12:39 PM  Result Value Ref Range   Preg Test, Ur NEGATIVE NEGATIVE   CBC     Status: Abnormal   Collection Time: 10/16/15  1:43 PM  Result Value Ref Range   WBC 6.9 4.0 - 10.5 K/uL   RBC 3.10 (L) 3.87 - 5.11 MIL/uL   Hemoglobin 10.1 (L) 12.0 - 15.0 g/dL   HCT 04.529.7 (L) 40.936.0 - 81.146.0 %   MCV 95.8 78.0 - 100.0 fL   MCH 32.6 26.0 - 34.0 pg   MCHC 34.0 30.0 - 36.0 g/dL   RDW 91.412.9 78.211.5 - 95.615.5 %   Platelets 298 150 - 400 K/uL   MAU Course  Procedures  MDM Labs are wnl. Hgb stable. Pt will need to follow up in clinic . Appointment is already made  Assessment and Plan  Post Op Bleeding Discharge to home  [facility]unders[facility] Luth Med CtrClemmons,Younique Casad Grissett 10/16/2015, 1:59 PM

## 2015-10-16 NOTE — Telephone Encounter (Signed)
Patient called and left message stating she was at the hospital on Saturday and saw Dr Shawnie PonsPratt. Patient states she had a vaginal laceration repaired. Patient states her bleeding slowed down after she went home but yesterday evening the bleeding picked back up again and she is bleeding heavy like a period. Patient wants to know if she should go to her regular OB GYN or go back to MAU. Called patient stating I am returning her phone call and asked patient if there was a chance that this was her period. Patient states no, she isn't due for another 2 weeks. Told patient she should return back to MAU for further evaluation in case she needs stitches again. Patient verbalized understanding and had no questions

## 2015-11-04 ENCOUNTER — Ambulatory Visit (INDEPENDENT_AMBULATORY_CARE_PROVIDER_SITE_OTHER): Payer: 59 | Admitting: Family Medicine

## 2015-11-04 ENCOUNTER — Encounter: Payer: Self-pay | Admitting: Family Medicine

## 2015-11-04 VITALS — BP 102/65 | HR 69 | Temp 97.8°F | Wt 158.0 lb

## 2015-11-04 DIAGNOSIS — R300 Dysuria: Secondary | ICD-10-CM | POA: Diagnosis not present

## 2015-11-04 DIAGNOSIS — Z3009 Encounter for other general counseling and advice on contraception: Secondary | ICD-10-CM

## 2015-11-04 DIAGNOSIS — Z309 Encounter for contraceptive management, unspecified: Secondary | ICD-10-CM

## 2015-11-04 DIAGNOSIS — Z09 Encounter for follow-up examination after completed treatment for conditions other than malignant neoplasm: Secondary | ICD-10-CM

## 2015-11-04 LAB — POCT URINALYSIS DIP (DEVICE)
Bilirubin Urine: NEGATIVE
Glucose, UA: NEGATIVE mg/dL
Ketones, ur: NEGATIVE mg/dL
Leukocytes, UA: NEGATIVE
Nitrite: NEGATIVE
Protein, ur: NEGATIVE mg/dL
Specific Gravity, Urine: <=1.005 (ref 1.005–1.030)
Urobilinogen, UA: 0.2 mg/dL (ref 0.0–1.0)
pH: 6.5 (ref 5.0–8.0)

## 2015-11-04 NOTE — Patient Instructions (Signed)

## 2015-11-04 NOTE — Progress Notes (Signed)
    Subjective:    Patient ID: Caroline Jackson is a 29 y.o. female presenting with Follow-up  on 11/04/2015  HPI: Here following surgical repair of vaginal laceration. Returned with bleeding 4 days later, minimal bleeding then.  Has stopped bleeding since Thanksgiving. Reports some urgency and dysuria, taking AZO.  Review of Systems  Constitutional: Negative for fever and chills.  Respiratory: Negative for shortness of breath.   Cardiovascular: Negative for chest pain.  Gastrointestinal: Negative for nausea, vomiting and abdominal pain.  Genitourinary: Positive for dysuria and urgency.  Skin: Negative for rash.      Objective:    BP 102/65 mmHg  Pulse 69  Temp(Src) 97.8 F (36.6 C)  Wt 158 lb (71.668 kg)  LMP 09/27/2015 Physical Exam  Constitutional: She is oriented to person, place, and time. She appears well-developed and well-nourished. No distress.  HENT:  Head: Normocephalic and atraumatic.  Eyes: No scleral icterus.  Neck: Neck supple.  Cardiovascular: Normal rate.   Pulmonary/Chest: Effort normal.  Abdominal: Soft.  Neurological: She is alert and oriented to person, place, and time.  Skin: Skin is warm and dry.  Psychiatric: She has a normal mood and affect.   Urinalysis    Component Value Date/Time   COLORURINE STRAW* 10/16/2015 1210   APPEARANCEUR CLEAR 10/16/2015 1210   LABSPEC <1.005* 10/16/2015 1210   PHURINE 5.5 10/16/2015 1210   GLUCOSEU NEGATIVE 10/16/2015 1210   HGBUR MODERATE* 10/16/2015 1210   BILIRUBINUR NEGATIVE 10/16/2015 1210   KETONESUR NEGATIVE 10/16/2015 1210   PROTEINUR NEGATIVE 10/16/2015 1210   UROBILINOGEN 0.2 05/20/2012 1502   NITRITE NEGATIVE 10/16/2015 1210   LEUKOCYTESUR NEGATIVE 10/16/2015 1210       Assessment & Plan:   Problem List Items Addressed This Visit    None    Visit Diagnoses    Postop check    -  Primary    Dysuria        Relevant Orders    Urine culture      Treat urinary culture if positive.  Ok to  resume normal sexual activity.  Return if symptoms worsen or fail to improve.  Selia Wareing S 11/04/2015 11:24 AM

## 2015-11-07 ENCOUNTER — Telehealth: Payer: Self-pay | Admitting: General Practice

## 2015-11-07 DIAGNOSIS — N390 Urinary tract infection, site not specified: Secondary | ICD-10-CM

## 2015-11-07 LAB — URINE CULTURE: Colony Count: 15000

## 2015-11-07 MED ORDER — SULFAMETHOXAZOLE-TRIMETHOPRIM 800-160 MG PO TABS: 1.0000 | ORAL_TABLET | Freq: Two times a day (BID) | ORAL | Status: AC

## 2015-11-07 NOTE — Telephone Encounter (Signed)
Per Dr Shawnie PonsPratt, urine culture shows UT, needs bactrim bid x3. Rx to pharmacy. Called patient, no answer- left message stating we are trying to reach you to let you know your urine culture does show a UTI and we have sent an antibiotic to your [facility] pharmacy. You can call us back if you have questions.

## 2016-06-18 ENCOUNTER — Ambulatory Visit
Admission: RE | Admit: 2016-06-18 | Discharge: 2016-06-18 | Disposition: A | Discharge status: Home / Self Care | Payer: 59 | Source: Ambulatory Visit | Attending: Internal Medicine | Admitting: Internal Medicine

## 2016-06-18 ENCOUNTER — Other Ambulatory Visit: Payer: Self-pay | Admitting: Internal Medicine

## 2016-06-18 DIAGNOSIS — M25472 Effusion, left ankle: Secondary | ICD-10-CM

## 2017-05-27 ENCOUNTER — Other Ambulatory Visit: Payer: Self-pay | Admitting: Internal Medicine

## 2017-05-27 ENCOUNTER — Ambulatory Visit
Admission: RE | Admit: 2017-05-27 | Discharge: 2017-05-27 | Disposition: A | Discharge status: Home / Self Care | Payer: 59 | Source: Ambulatory Visit | Attending: Internal Medicine | Admitting: Internal Medicine

## 2017-05-27 DIAGNOSIS — M25472 Effusion, left ankle: Secondary | ICD-10-CM

## 2018-02-01 DIAGNOSIS — M545 Low back pain: Secondary | ICD-10-CM | POA: Diagnosis not present

## 2018-02-08 DIAGNOSIS — Z01419 Encounter for gynecological examination (general) (routine) without abnormal findings: Secondary | ICD-10-CM | POA: Diagnosis not present

## 2018-02-08 DIAGNOSIS — R87612 Low grade squamous intraepithelial lesion on cytologic smear of cervix (LGSIL): Secondary | ICD-10-CM | POA: Diagnosis not present

## 2018-02-08 DIAGNOSIS — Z6824 Body mass index (BMI) 24.0-24.9, adult: Secondary | ICD-10-CM | POA: Diagnosis not present

## 2018-02-15 DIAGNOSIS — M545 Low back pain: Secondary | ICD-10-CM | POA: Diagnosis not present

## 2018-03-02 DIAGNOSIS — M545 Low back pain: Secondary | ICD-10-CM | POA: Diagnosis not present

## 2018-10-26 DIAGNOSIS — J029 Acute pharyngitis, unspecified: Secondary | ICD-10-CM | POA: Diagnosis not present

## 2018-12-06 DIAGNOSIS — Z124 Encounter for screening for malignant neoplasm of cervix: Secondary | ICD-10-CM | POA: Diagnosis not present

## 2018-12-06 DIAGNOSIS — Z0000 Encounter for general adult medical examination without abnormal findings: Secondary | ICD-10-CM | POA: Diagnosis not present

## 2018-12-06 DIAGNOSIS — Z23 Encounter for immunization: Secondary | ICD-10-CM | POA: Diagnosis not present

## 2019-01-04 DIAGNOSIS — K2900 Acute gastritis without bleeding: Secondary | ICD-10-CM | POA: Diagnosis not present

## 2019-01-10 DIAGNOSIS — S92811A Other fracture of right foot, initial encounter for closed fracture: Secondary | ICD-10-CM | POA: Diagnosis not present

## 2019-01-14 DIAGNOSIS — M79671 Pain in right foot: Secondary | ICD-10-CM | POA: Diagnosis not present

## 2019-01-18 ENCOUNTER — Other Ambulatory Visit: Payer: Self-pay | Admitting: Internal Medicine

## 2019-01-18 DIAGNOSIS — R1013 Epigastric pain: Secondary | ICD-10-CM | POA: Diagnosis not present

## 2019-01-19 ENCOUNTER — Ambulatory Visit
Admission: RE | Admit: 2019-01-19 | Discharge: 2019-01-19 | Disposition: A | Discharge status: Home / Self Care | Payer: BLUE CROSS/BLUE SHIELD | Source: Ambulatory Visit | Attending: Internal Medicine | Admitting: Internal Medicine

## 2019-01-19 DIAGNOSIS — R109 Unspecified abdominal pain: Secondary | ICD-10-CM | POA: Diagnosis not present

## 2019-01-19 DIAGNOSIS — R1013 Epigastric pain: Secondary | ICD-10-CM

## 2019-01-20 DIAGNOSIS — M2580 Other specified joint disorders, unspecified joint: Secondary | ICD-10-CM | POA: Diagnosis not present

## 2019-01-20 DIAGNOSIS — M79671 Pain in right foot: Secondary | ICD-10-CM | POA: Diagnosis not present

## 2019-02-08 DIAGNOSIS — F320 Major depressive disorder, single episode, mild: Secondary | ICD-10-CM | POA: Diagnosis not present

## 2019-02-15 DIAGNOSIS — F320 Major depressive disorder, single episode, mild: Secondary | ICD-10-CM | POA: Diagnosis not present

## 2019-03-16 DIAGNOSIS — F320 Major depressive disorder, single episode, mild: Secondary | ICD-10-CM | POA: Diagnosis not present

## 2019-04-17 DIAGNOSIS — F320 Major depressive disorder, single episode, mild: Secondary | ICD-10-CM | POA: Diagnosis not present

## 2019-05-04 DIAGNOSIS — F320 Major depressive disorder, single episode, mild: Secondary | ICD-10-CM | POA: Diagnosis not present

## 2019-05-11 DIAGNOSIS — Z1151 Encounter for screening for human papillomavirus (HPV): Secondary | ICD-10-CM | POA: Diagnosis not present

## 2019-05-11 DIAGNOSIS — R87612 Low grade squamous intraepithelial lesion on cytologic smear of cervix (LGSIL): Secondary | ICD-10-CM | POA: Diagnosis not present

## 2019-05-11 DIAGNOSIS — Z6825 Body mass index (BMI) 25.0-25.9, adult: Secondary | ICD-10-CM | POA: Diagnosis not present

## 2019-05-11 DIAGNOSIS — Z01419 Encounter for gynecological examination (general) (routine) without abnormal findings: Secondary | ICD-10-CM | POA: Diagnosis not present

## 2019-08-17 DIAGNOSIS — J029 Acute pharyngitis, unspecified: Secondary | ICD-10-CM | POA: Diagnosis not present

## 2020-07-04 IMAGING — US US ABDOMEN COMPLETE
1 series · 14 of 25 positions shown · non-contrast
Comparison: CT abdomen and pelvis May 20, 2012

CLINICAL DATA: Abdominal pain

EXAM:
ABDOMEN ULTRASOUND COMPLETE

[Series 1: us abdomen complete · 0.26mm/px · 14 of 96 slices shown]
[im 1/96]
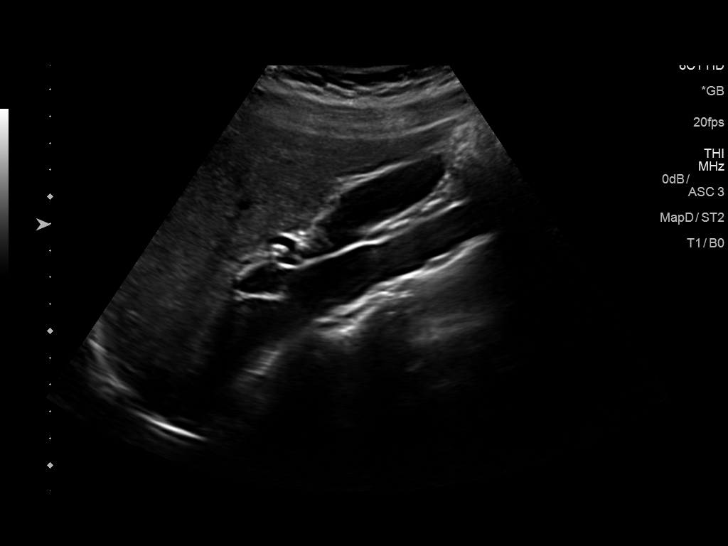
[im 8/96]
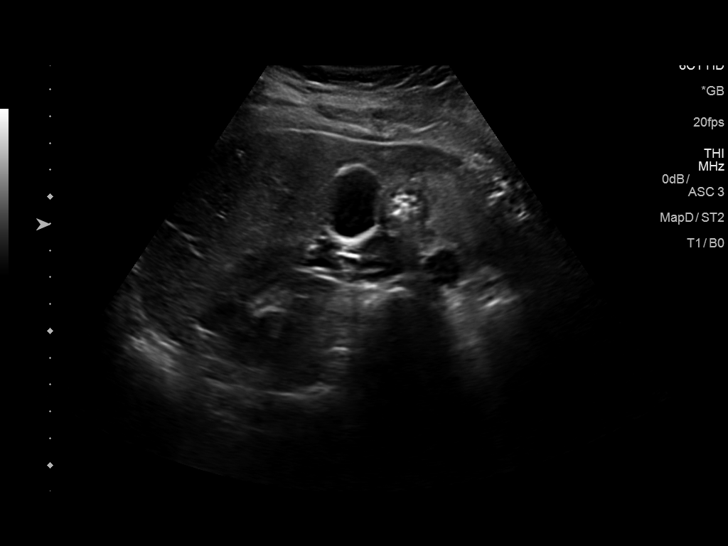
[im 16/96]
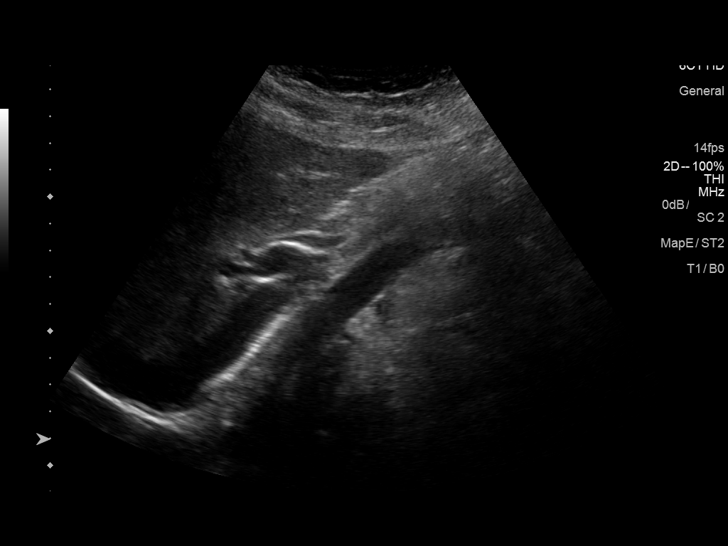
[im 24/96]
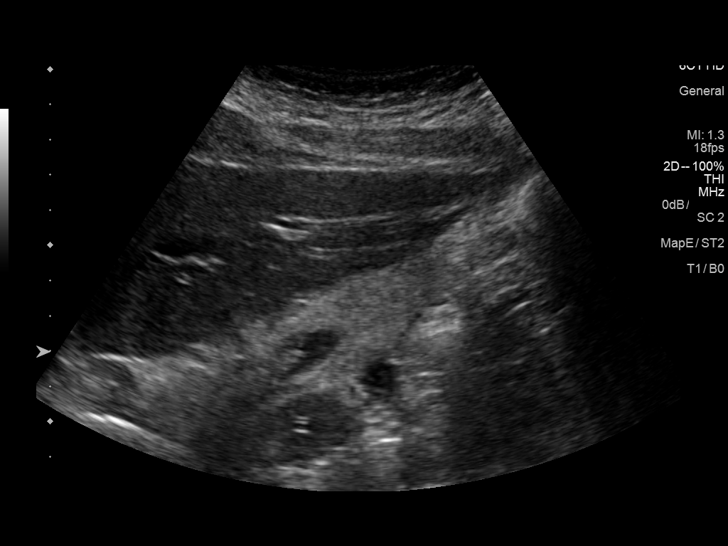
[im 32/96]
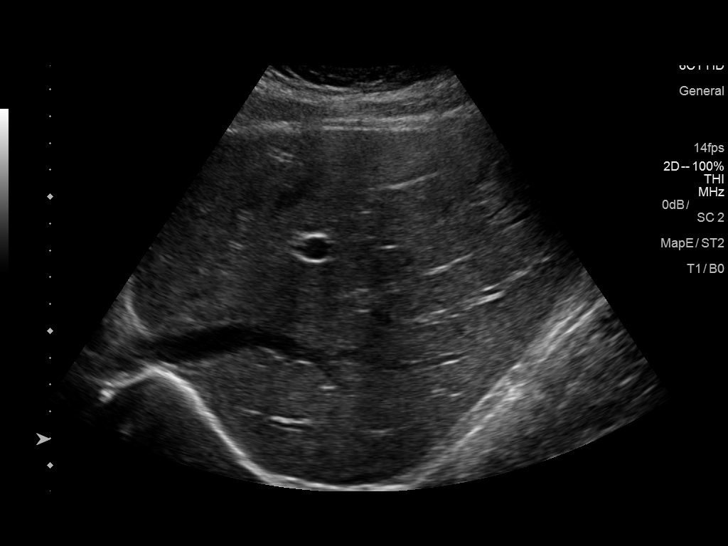
[im 36/96]
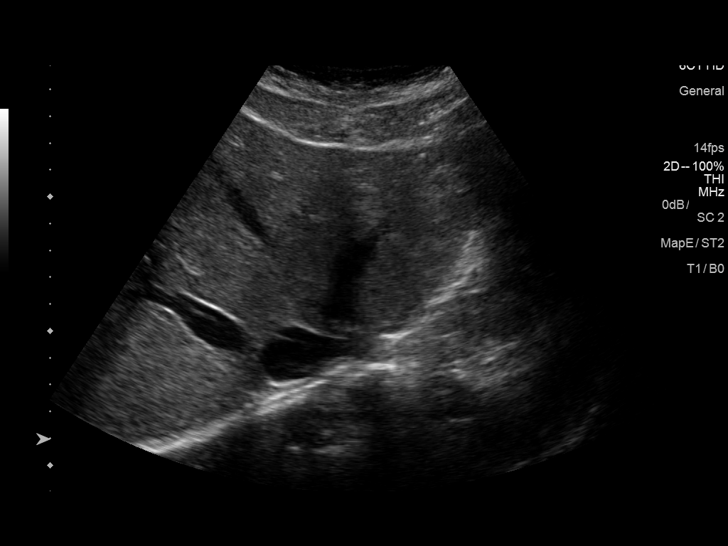
[im 44/96]
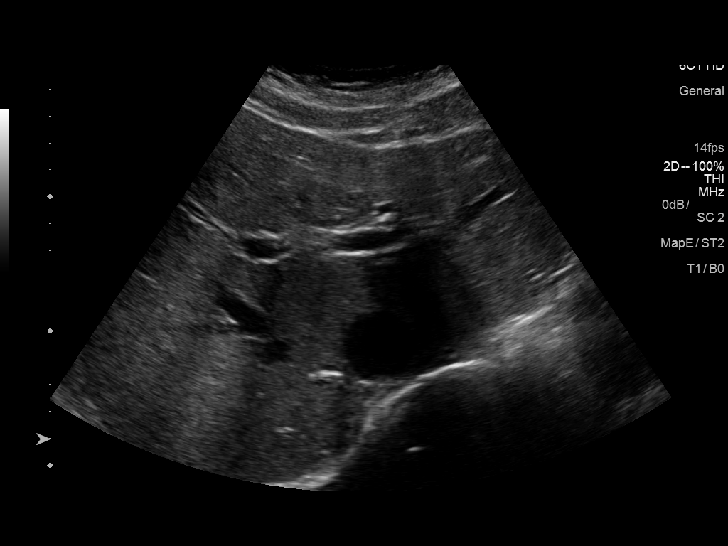
[im 52/96]
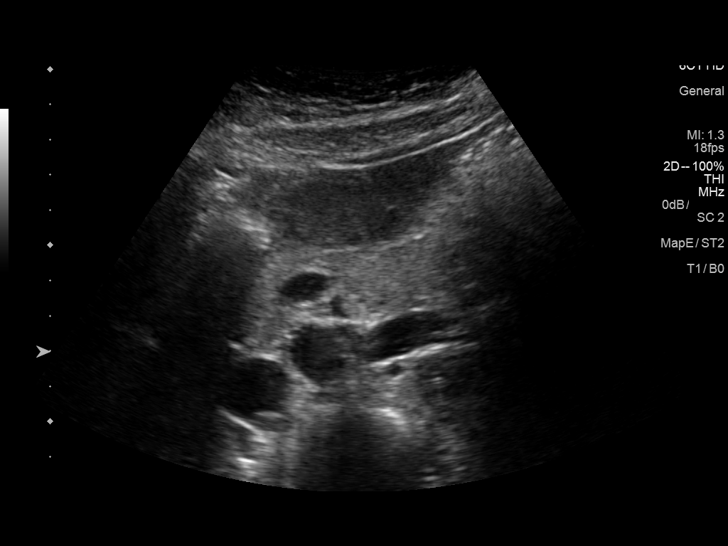
[im 60/96]
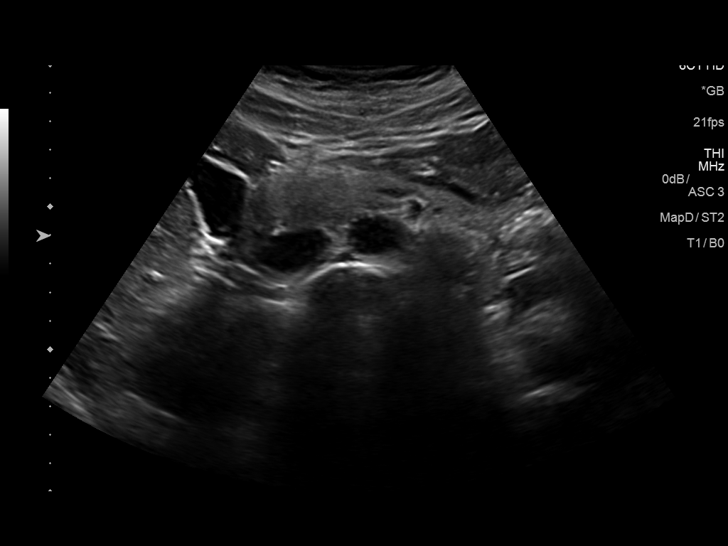
[im 64/96]
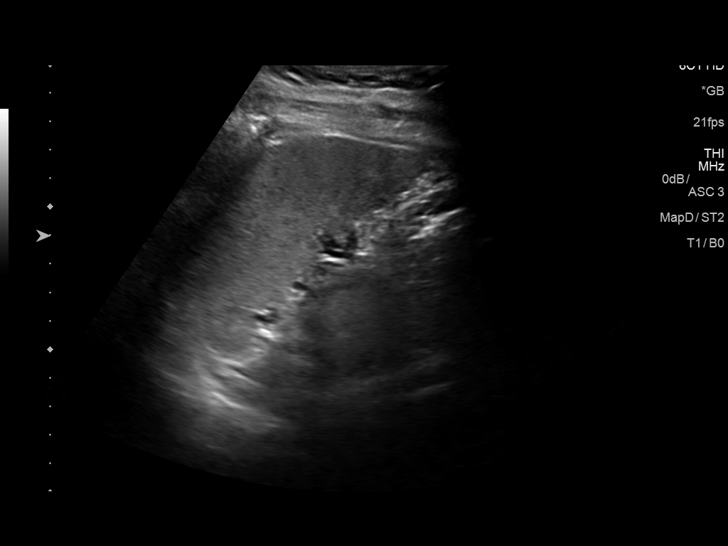
[im 72/96]
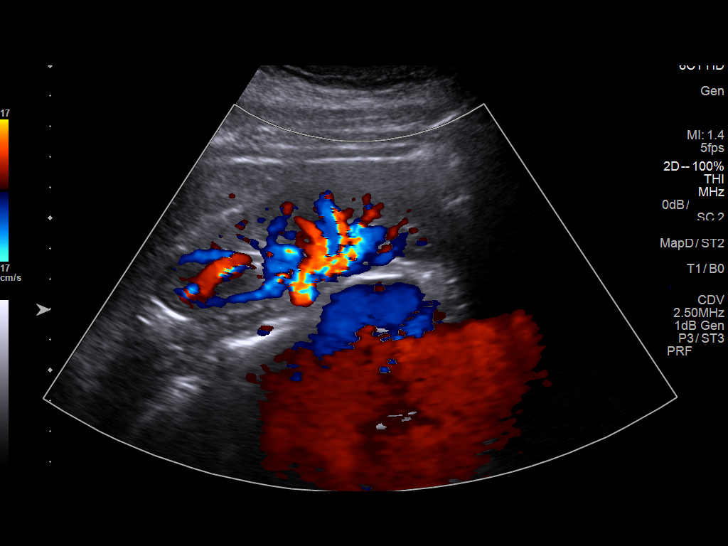
[im 80/96]
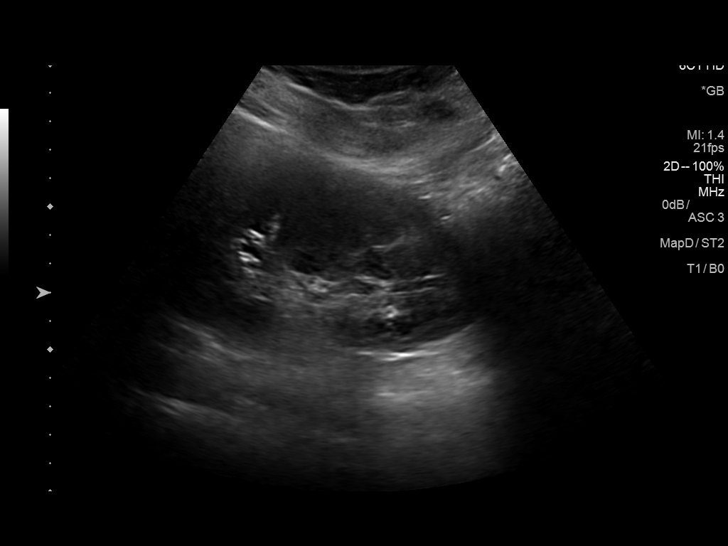
[im 88/96]
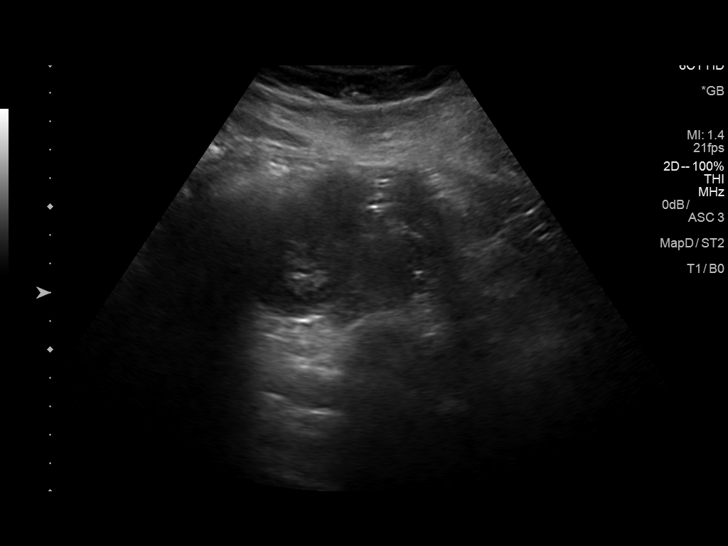
[im 96/96]
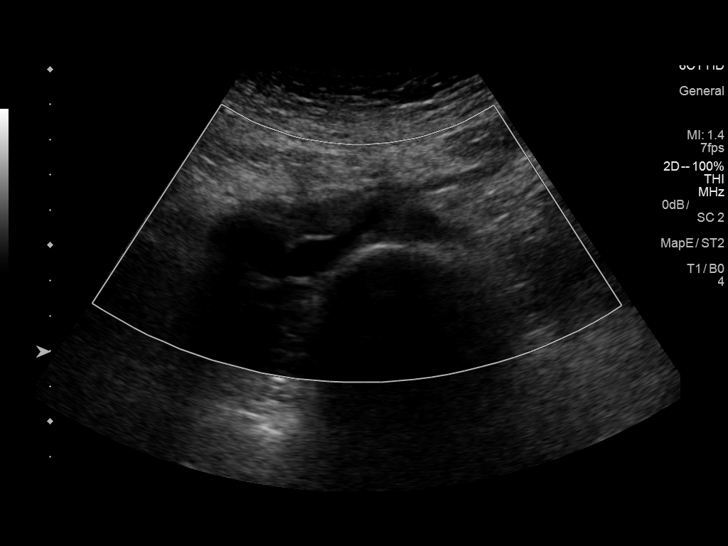

[14 of 25 positions shown; findings below may reference images not displayed]

FINDINGS: Gallbladder: No gallstones or wall thickening visualized. There is
no pericholecystic fluid. No sonographic Murphy sign noted by
sonographer.

Common bile duct: Diameter: 5 mm. No intrahepatic, common hepatic,
or common bile duct dilatation.

Liver: No focal lesion identified. Within normal limits in
parenchymal echogenicity. Portal vein is patent on color Doppler
imaging with normal direction of blood flow towards the liver.

IVC: No abnormality visualized.

Pancreas: No pancreatic mass or inflammatory focus.

Spleen: Size and appearance within normal limits.

Right Kidney: Length: 10.3 cm. Echogenicity within normal limits. No
mass or hydronephrosis visualized.

Left Kidney: Length: 11.3 cm. Echogenicity within normal limits. No
mass or hydronephrosis visualized.

Abdominal aorta: No aneurysm visualized.

Other findings: No demonstrable ascites.
IMPRESSION: Study within normal limits.

## 2020-12-01 DIAGNOSIS — Z20822 Contact with and (suspected) exposure to covid-19: Secondary | ICD-10-CM | POA: Diagnosis not present

## 2021-01-28 DIAGNOSIS — M1990 Unspecified osteoarthritis, unspecified site: Secondary | ICD-10-CM | POA: Diagnosis not present

## 2021-01-28 DIAGNOSIS — E061 Subacute thyroiditis: Secondary | ICD-10-CM | POA: Diagnosis not present

## 2021-01-28 DIAGNOSIS — Z0000 Encounter for general adult medical examination without abnormal findings: Secondary | ICD-10-CM | POA: Diagnosis not present

## 2021-04-14 DIAGNOSIS — J029 Acute pharyngitis, unspecified: Secondary | ICD-10-CM | POA: Diagnosis not present

## 2021-05-28 DIAGNOSIS — M25531 Pain in right wrist: Secondary | ICD-10-CM | POA: Diagnosis not present

## 2021-05-28 DIAGNOSIS — M9907 Segmental and somatic dysfunction of upper extremity: Secondary | ICD-10-CM | POA: Diagnosis not present

## 2021-06-10 DIAGNOSIS — M25531 Pain in right wrist: Secondary | ICD-10-CM | POA: Diagnosis not present

## 2021-06-12 ENCOUNTER — Ambulatory Visit
Admission: RE | Admit: 2021-06-12 | Discharge: 2021-06-12 | Disposition: A | Discharge status: Home / Self Care | Payer: BLUE CROSS/BLUE SHIELD | Source: Ambulatory Visit | Attending: Sports Medicine | Admitting: Sports Medicine

## 2021-06-12 ENCOUNTER — Other Ambulatory Visit: Payer: Self-pay | Admitting: Sports Medicine

## 2021-06-12 ENCOUNTER — Other Ambulatory Visit: Payer: Self-pay

## 2021-06-12 DIAGNOSIS — M25531 Pain in right wrist: Secondary | ICD-10-CM

## 2021-06-17 ENCOUNTER — Other Ambulatory Visit: Payer: Self-pay | Admitting: Sports Medicine

## 2021-06-17 DIAGNOSIS — M25531 Pain in right wrist: Secondary | ICD-10-CM

## 2021-06-26 ENCOUNTER — Ambulatory Visit
Admission: RE | Admit: 2021-06-26 | Discharge: 2021-06-26 | Disposition: A | Discharge status: Home / Self Care | Payer: BLUE CROSS/BLUE SHIELD | Source: Ambulatory Visit | Attending: Sports Medicine | Admitting: Sports Medicine

## 2021-06-26 ENCOUNTER — Other Ambulatory Visit: Payer: Self-pay

## 2021-06-26 DIAGNOSIS — M65841 Other synovitis and tenosynovitis, right hand: Secondary | ICD-10-CM | POA: Diagnosis not present

## 2021-06-26 DIAGNOSIS — M25531 Pain in right wrist: Secondary | ICD-10-CM

## 2021-06-26 DIAGNOSIS — M19031 Primary osteoarthritis, right wrist: Secondary | ICD-10-CM | POA: Diagnosis not present

## 2021-07-01 DIAGNOSIS — M79641 Pain in right hand: Secondary | ICD-10-CM | POA: Diagnosis not present

## 2021-07-01 DIAGNOSIS — M25541 Pain in joints of right hand: Secondary | ICD-10-CM | POA: Diagnosis not present

## 2021-07-01 DIAGNOSIS — M25531 Pain in right wrist: Secondary | ICD-10-CM | POA: Diagnosis not present

## 2021-07-01 DIAGNOSIS — R208 Other disturbances of skin sensation: Secondary | ICD-10-CM | POA: Diagnosis not present

## 2021-07-04 DIAGNOSIS — E061 Subacute thyroiditis: Secondary | ICD-10-CM | POA: Diagnosis not present

## 2021-07-04 DIAGNOSIS — R946 Abnormal results of thyroid function studies: Secondary | ICD-10-CM | POA: Diagnosis not present

## 2021-07-24 DIAGNOSIS — R6889 Other general symptoms and signs: Secondary | ICD-10-CM | POA: Diagnosis not present

## 2021-07-24 DIAGNOSIS — R5383 Other fatigue: Secondary | ICD-10-CM | POA: Diagnosis not present

## 2021-07-24 DIAGNOSIS — E038 Other specified hypothyroidism: Secondary | ICD-10-CM | POA: Diagnosis not present

## 2021-07-24 DIAGNOSIS — E538 Deficiency of other specified B group vitamins: Secondary | ICD-10-CM | POA: Diagnosis not present

## 2021-07-24 DIAGNOSIS — Z789 Other specified health status: Secondary | ICD-10-CM | POA: Diagnosis not present

## 2021-08-12 DIAGNOSIS — M9907 Segmental and somatic dysfunction of upper extremity: Secondary | ICD-10-CM | POA: Diagnosis not present

## 2021-08-12 DIAGNOSIS — M25531 Pain in right wrist: Secondary | ICD-10-CM | POA: Diagnosis not present

## 2021-08-12 DIAGNOSIS — M79641 Pain in right hand: Secondary | ICD-10-CM | POA: Diagnosis not present

## 2021-08-12 DIAGNOSIS — R208 Other disturbances of skin sensation: Secondary | ICD-10-CM | POA: Diagnosis not present

## 2021-09-04 DIAGNOSIS — E038 Other specified hypothyroidism: Secondary | ICD-10-CM | POA: Diagnosis not present

## 2021-09-16 DIAGNOSIS — Z6827 Body mass index (BMI) 27.0-27.9, adult: Secondary | ICD-10-CM | POA: Diagnosis not present

## 2021-09-16 DIAGNOSIS — Z3041 Encounter for surveillance of contraceptive pills: Secondary | ICD-10-CM | POA: Diagnosis not present

## 2021-09-16 DIAGNOSIS — Z124 Encounter for screening for malignant neoplasm of cervix: Secondary | ICD-10-CM | POA: Diagnosis not present

## 2021-09-16 DIAGNOSIS — Z01419 Encounter for gynecological examination (general) (routine) without abnormal findings: Secondary | ICD-10-CM | POA: Diagnosis not present

## 2021-11-06 DIAGNOSIS — Z713 Dietary counseling and surveillance: Secondary | ICD-10-CM | POA: Diagnosis not present

## 2021-12-03 ENCOUNTER — Ambulatory Visit
Admission: RE | Admit: 2021-12-03 | Discharge: 2021-12-03 | Disposition: A | Discharge status: Home / Self Care | Payer: BC Managed Care – PPO | Source: Ambulatory Visit | Attending: Internal Medicine | Admitting: Internal Medicine

## 2021-12-03 ENCOUNTER — Other Ambulatory Visit: Payer: Self-pay | Admitting: Internal Medicine

## 2021-12-03 DIAGNOSIS — M79642 Pain in left hand: Secondary | ICD-10-CM | POA: Diagnosis not present

## 2021-12-03 DIAGNOSIS — E038 Other specified hypothyroidism: Secondary | ICD-10-CM | POA: Diagnosis not present

## 2021-12-03 DIAGNOSIS — E559 Vitamin D deficiency, unspecified: Secondary | ICD-10-CM | POA: Diagnosis not present

## 2021-12-03 DIAGNOSIS — M19041 Primary osteoarthritis, right hand: Secondary | ICD-10-CM | POA: Diagnosis not present

## 2021-12-03 DIAGNOSIS — M19049 Primary osteoarthritis, unspecified hand: Secondary | ICD-10-CM

## 2021-12-03 DIAGNOSIS — E538 Deficiency of other specified B group vitamins: Secondary | ICD-10-CM | POA: Diagnosis not present

## 2021-12-29 DIAGNOSIS — Z8261 Family history of arthritis: Secondary | ICD-10-CM | POA: Diagnosis not present

## 2021-12-29 DIAGNOSIS — E559 Vitamin D deficiency, unspecified: Secondary | ICD-10-CM | POA: Diagnosis not present

## 2021-12-29 DIAGNOSIS — E038 Other specified hypothyroidism: Secondary | ICD-10-CM | POA: Diagnosis not present

## 2022-01-01 DIAGNOSIS — L243 Irritant contact dermatitis due to cosmetics: Secondary | ICD-10-CM | POA: Diagnosis not present

## 2022-01-06 ENCOUNTER — Other Ambulatory Visit: Payer: Self-pay

## 2022-01-06 ENCOUNTER — Ambulatory Visit (HOSPITAL_COMMUNITY)
Admission: EM | Admit: 2022-01-06 | Discharge: 2022-01-06 | Disposition: A | Discharge status: Home / Self Care | Payer: BC Managed Care – PPO | Attending: Student | Admitting: Student

## 2022-01-06 ENCOUNTER — Encounter (HOSPITAL_COMMUNITY): Payer: Self-pay

## 2022-01-06 DIAGNOSIS — Z23 Encounter for immunization: Secondary | ICD-10-CM | POA: Diagnosis not present

## 2022-01-06 DIAGNOSIS — L03113 Cellulitis of right upper limb: Secondary | ICD-10-CM

## 2022-01-06 MED ORDER — TETANUS-DIPHTH-ACELL PERTUSSIS 5-2.5-18.5 LF-MCG/0.5 IM SUSY
0.5000 mL | PREFILLED_SYRINGE | Freq: Once | INTRAMUSCULAR | Status: AC
  Administered 2022-01-06: 0.5 mL via INTRAMUSCULAR

## 2022-01-06 MED ORDER — TETANUS-DIPHTH-ACELL PERTUSSIS 5-2.5-18.5 LF-MCG/0.5 IM SUSY
PREFILLED_SYRINGE | INTRAMUSCULAR | Status: AC
  Filled 2022-01-06: qty 0.5

## 2022-01-06 MED ORDER — DOXYCYCLINE HYCLATE 100 MG PO CAPS: 100.0000 mg | ORAL_CAPSULE | Freq: Two times a day (BID) | ORAL | 0 refills | Status: AC

## 2022-01-06 NOTE — ED Triage Notes (Signed)
Pt presents with possible tattoo infection on right arm X 2 days; pt states arm is burning and has welted up in some areas.

## 2022-01-06 NOTE — Discharge Instructions (Addendum)
-  Doxycycline twice daily for 7 days.  Make sure to wear sunscreen while spending time outside while on this medication as it can increase your chance of sunburn. You can take this medication with food if you have a sensitive stomach. -Wash with gentle soap and water only -Follow-up if symptoms getting worse instead of better - pain, swelling, redness, fevers

## 2022-01-06 NOTE — ED Provider Notes (Signed)
[facility]    CSN: [id number] Arrival date & time: 01/06/22  1901      History   Chief Complaint Chief Complaint  Patient presents with   Wound Infection    HPI Caroline Jackson is a 36 y.o. female presenting with concern for infected tattoo on her right arm for about 2 days.  States that she recently had more added to the tattoo, and these are the areas that seem infected.  She has had a tattoo infection in the past and the symptoms are the same.  Has been washing with soap every 4 hours and applying Aquaphor on top.  HPI  Past Medical History:  Diagnosis Date   Arthritis     Past Surgical History:  Procedure Laterality Date   APPENDECTOMY     ESOPHAGOGASTRODUODENOSCOPY     KNEE SURGERY     x 2 left knee, orthoscopic    LAPAROSCOPIC APPENDECTOMY  05/20/2012   Procedure: APPENDECTOMY LAPAROSCOPIC;  Surgeon: Caroline Ina, MD,FACS;  Location: [facility] OR;  Service: General;  Laterality: N/A;   orthoscopic  left knee   PERINEAL LACERATION REPAIR N/A 10/12/2015   Procedure: SUTURE REPAIR PERINEAL LACERATION;  Surgeon: Caroline Bores, MD;  Location: [facility];  Service: Gynecology;  Laterality: N/A;    OB History     Gravida  0   Para  0   Term  0   Preterm  0   AB  0   Living  0      SAB  0   IAB  0   Ectopic  0   Multiple  0   Live Births               Home Medications    Prior to Admission medications   Medication Sig Start Date End Date Taking? Authorizing Provider  doxycycline (VIBRAMYCIN) 100 MG capsule Take 1 capsule (100 mg total) by mouth 2 (two) times daily for 7 days. 01/06/22 01/13/22 Yes Rhys Martini, PA-C  acetaminophen (TYLENOL) 325 MG tablet Take 650 mg by mouth every 6 (six) hours as needed for mild pain.    [provider]  ibuprofen (ADVIL,MOTRIN) 200 MG tablet Take 800 mg by mouth every 6 (six) hours as needed for moderate pain.    [provider]  MICROGESTIN FE 1/20 1-20 MG-MCG tablet  10/22/15   [provider]  oxyCODONE-acetaminophen (PERCOCET/ROXICET) 5-325 MG tablet Take 1-2 tablets by mouth every 6 (six) hours as needed. Patient not taking: Reported on 11/04/2015 10/12/15   Caroline Bores, MD  sulfamethoxazole-trimethoprim (BACTRIM DS,SEPTRA DS) 800-160 MG tablet Take 1 tablet by mouth 2 (two) times daily. 11/07/15   Caroline Bores, MD    Family History Family History  Problem Relation Age of Onset   Arthritis Mother    Hypertension Father    Diabetes Maternal Grandmother     Social History Social History   Tobacco Use   Smoking status: Never  Substance Use Topics   Alcohol use: Yes    Comment: social   Drug use: No     Allergies   Latex   Review of Systems Review of Systems  Skin:  Positive for color change.  All other systems reviewed and are negative.   Physical Exam Triage Vital Signs ED Triage Vitals  Enc Vitals Group     BP 01/06/22 2018 125/83     Pulse Rate 01/06/22 2018 70     Resp 01/06/22 2018  18     Temp 01/06/22 2018 98.2 F (36.8 C)     Temp Source 01/06/22 2018 Oral     SpO2 01/06/22 2018 96 %     Weight --      Height --      Head Circumference --      Peak Flow --      Pain Score 01/06/22 2017 3     Pain Loc --      Pain Edu? --      Excl. in GC? --    No data found.  Updated Vital Signs BP 125/83 (BP Location: Left Arm)    Pulse 70    Temp 98.2 F (36.8 C) (Oral)    Resp 18    SpO2 96%   Visual Acuity Right Eye Distance:   Left Eye Distance:   Bilateral Distance:    Right Eye Near:   Left Eye Near:    Bilateral Near:     Physical Exam Vitals reviewed.  Constitutional:      General: She is not in acute distress.    Appearance: Normal appearance. She is not ill-appearing.  HENT:     Head: Normocephalic and atraumatic.  Pulmonary:     Effort: Pulmonary effort is normal.  Skin:    Findings: Erythema present.     Comments: See image below R upper arm with extensive tattoo and some overlying warmth and erythema.  TTP.   Neurological:     General: No focal deficit present.     Mental Status: She is alert and oriented to person, place, and time.  Psychiatric:        Mood and Affect: Mood normal.        Behavior: Behavior normal.        Thought Content: Thought content normal.        Judgment: Judgment normal.      UC Treatments / Results  Labs (all labs ordered are listed, but only abnormal results are displayed) Labs Reviewed - No data to display  EKG   Radiology No results found.  Procedures Procedures (including critical care time)  Medications Ordered in UC Medications  Tdap (BOOSTRIX) injection 0.5 mL (has no administration in time range)    Initial Impression / Assessment and Plan / UC Course  I have reviewed the triage vital signs and the nursing notes.  Pertinent labs & imaging results that were available during my care of the patient were reviewed by me and considered in my medical decision making (see chart for details).     This patient is a very pleasant 36 y.o. year old female presenting with infected tattoo. Afebrile, nontachy. Tdap not UTD, administered today. OCP contraception. Doxycyline sent. Wound care discussed. ED return precautions discussed. Patient verbalizes understanding and agreement.  .   Final Clinical Impressions(s) / UC Diagnoses   Final diagnoses:  Cellulitis of right upper extremity  Need for Tdap vaccination     Discharge Instructions      -Doxycycline twice daily for 7 days.  Make sure to wear sunscreen while spending time outside while on this medication as it can increase your chance of sunburn. You can take this medication with food if you have a sensitive stomach. -Wash with gentle soap and water only -Follow-up if symptoms getting worse instead of better - pain, swelling, redness, fevers   ED Prescriptions     Medication Sig Dispense Auth. Provider   doxycycline (VIBRAMYCIN) 100 MG capsule Take 1 capsule (  100 mg total) by mouth  2 (two) times daily for 7 days. 14 capsule Rhys Martini, PA-C      PDMP not reviewed this encounter.   Rhys Martini, PA-C 01/06/22 2048

## 2022-01-13 DIAGNOSIS — L2389 Allergic contact dermatitis due to other agents: Secondary | ICD-10-CM | POA: Diagnosis not present

## 2022-01-28 DIAGNOSIS — L2389 Allergic contact dermatitis due to other agents: Secondary | ICD-10-CM | POA: Diagnosis not present

## 2022-01-28 DIAGNOSIS — L989 Disorder of the skin and subcutaneous tissue, unspecified: Secondary | ICD-10-CM | POA: Diagnosis not present

## 2022-02-04 DIAGNOSIS — Z0000 Encounter for general adult medical examination without abnormal findings: Secondary | ICD-10-CM | POA: Diagnosis not present

## 2022-02-09 DIAGNOSIS — L818 Other specified disorders of pigmentation: Secondary | ICD-10-CM | POA: Diagnosis not present

## 2022-02-19 DIAGNOSIS — R21 Rash and other nonspecific skin eruption: Secondary | ICD-10-CM | POA: Diagnosis not present

## 2022-03-18 DIAGNOSIS — E063 Autoimmune thyroiditis: Secondary | ICD-10-CM | POA: Diagnosis not present

## 2022-03-18 DIAGNOSIS — E049 Nontoxic goiter, unspecified: Secondary | ICD-10-CM | POA: Diagnosis not present

## 2022-03-18 DIAGNOSIS — Z0000 Encounter for general adult medical examination without abnormal findings: Secondary | ICD-10-CM | POA: Diagnosis not present

## 2022-03-18 DIAGNOSIS — E538 Deficiency of other specified B group vitamins: Secondary | ICD-10-CM | POA: Diagnosis not present

## 2022-03-18 DIAGNOSIS — E559 Vitamin D deficiency, unspecified: Secondary | ICD-10-CM | POA: Diagnosis not present

## 2022-03-18 DIAGNOSIS — R230 Cyanosis: Secondary | ICD-10-CM | POA: Diagnosis not present

## 2022-03-18 DIAGNOSIS — E038 Other specified hypothyroidism: Secondary | ICD-10-CM | POA: Diagnosis not present

## 2022-04-01 DIAGNOSIS — E038 Other specified hypothyroidism: Secondary | ICD-10-CM | POA: Diagnosis not present

## 2022-04-01 DIAGNOSIS — E063 Autoimmune thyroiditis: Secondary | ICD-10-CM | POA: Diagnosis not present

## 2022-04-01 DIAGNOSIS — E049 Nontoxic goiter, unspecified: Secondary | ICD-10-CM | POA: Diagnosis not present

## 2022-04-14 DIAGNOSIS — R230 Cyanosis: Secondary | ICD-10-CM | POA: Diagnosis not present

## 2022-04-14 DIAGNOSIS — R200 Anesthesia of skin: Secondary | ICD-10-CM | POA: Diagnosis not present

## 2022-05-18 DIAGNOSIS — R300 Dysuria: Secondary | ICD-10-CM | POA: Diagnosis not present

## 2022-06-22 DIAGNOSIS — L259 Unspecified contact dermatitis, unspecified cause: Secondary | ICD-10-CM | POA: Diagnosis not present

## 2022-06-25 DIAGNOSIS — M25531 Pain in right wrist: Secondary | ICD-10-CM | POA: Diagnosis not present

## 2022-06-25 DIAGNOSIS — L230 Allergic contact dermatitis due to metals: Secondary | ICD-10-CM | POA: Diagnosis not present

## 2022-06-25 DIAGNOSIS — L235 Allergic contact dermatitis due to other chemical products: Secondary | ICD-10-CM | POA: Diagnosis not present

## 2022-06-25 DIAGNOSIS — L232 Allergic contact dermatitis due to cosmetics: Secondary | ICD-10-CM | POA: Diagnosis not present

## 2022-06-25 DIAGNOSIS — M25431 Effusion, right wrist: Secondary | ICD-10-CM | POA: Diagnosis not present

## 2022-08-25 DIAGNOSIS — L650 Telogen effluvium: Secondary | ICD-10-CM | POA: Diagnosis not present

## 2022-08-25 DIAGNOSIS — R5383 Other fatigue: Secondary | ICD-10-CM | POA: Diagnosis not present

## 2022-08-25 DIAGNOSIS — R339 Retention of urine, unspecified: Secondary | ICD-10-CM | POA: Diagnosis not present

## 2022-08-25 DIAGNOSIS — R350 Frequency of micturition: Secondary | ICD-10-CM | POA: Diagnosis not present

## 2022-08-25 DIAGNOSIS — R3129 Other microscopic hematuria: Secondary | ICD-10-CM | POA: Diagnosis not present

## 2022-08-25 DIAGNOSIS — E033 Postinfectious hypothyroidism: Secondary | ICD-10-CM | POA: Diagnosis not present

## 2022-09-24 DIAGNOSIS — Z01419 Encounter for gynecological examination (general) (routine) without abnormal findings: Secondary | ICD-10-CM | POA: Diagnosis not present

## 2022-09-24 DIAGNOSIS — Z6825 Body mass index (BMI) 25.0-25.9, adult: Secondary | ICD-10-CM | POA: Diagnosis not present
